# Patient Record
Sex: Female | Born: 1975 | Race: White | Hispanic: No | Marital: Single | Smoking: Former smoker
Health system: Southern US, Community
[De-identification: ages and names within clinical notes are randomized; demographics above are authoritative.]

## PROBLEM LIST (undated history)

## (undated) DIAGNOSIS — F419 Anxiety disorder, unspecified: Secondary | ICD-10-CM

## (undated) HISTORY — DX: Anxiety disorder, unspecified: F41.9

---

## 2015-05-22 ENCOUNTER — Emergency Department (HOSPITAL_COMMUNITY): Payer: Self-pay

## 2015-05-22 ENCOUNTER — Emergency Department (HOSPITAL_COMMUNITY)
Admission: EM | Admit: 2015-05-22 | Discharge: 2015-05-22 | Disposition: A | Discharge status: Home / Self Care | Payer: Self-pay | Attending: Emergency Medicine | Admitting: Emergency Medicine

## 2015-05-22 ENCOUNTER — Encounter (HOSPITAL_COMMUNITY): Payer: Self-pay | Admitting: Emergency Medicine

## 2015-05-22 DIAGNOSIS — F17200 Nicotine dependence, unspecified, uncomplicated: Secondary | ICD-10-CM | POA: Insufficient documentation

## 2015-05-22 DIAGNOSIS — K760 Fatty (change of) liver, not elsewhere classified: Secondary | ICD-10-CM | POA: Insufficient documentation

## 2015-05-22 DIAGNOSIS — F1010 Alcohol abuse, uncomplicated: Secondary | ICD-10-CM | POA: Insufficient documentation

## 2015-05-22 DIAGNOSIS — R112 Nausea with vomiting, unspecified: Secondary | ICD-10-CM

## 2015-05-22 DIAGNOSIS — R1013 Epigastric pain: Secondary | ICD-10-CM

## 2015-05-22 DIAGNOSIS — R197 Diarrhea, unspecified: Secondary | ICD-10-CM

## 2015-05-22 DIAGNOSIS — R52 Pain, unspecified: Secondary | ICD-10-CM

## 2015-05-22 DIAGNOSIS — F419 Anxiety disorder, unspecified: Secondary | ICD-10-CM | POA: Insufficient documentation

## 2015-05-22 DIAGNOSIS — Z789 Other specified health status: Secondary | ICD-10-CM

## 2015-05-22 LAB — CBC
HCT: 46.3 % — ABNORMAL HIGH (ref 36.0–46.0)
Hemoglobin: 17 g/dL — ABNORMAL HIGH (ref 12.0–15.0)
MCH: 35.1 pg — ABNORMAL HIGH (ref 26.0–34.0)
MCHC: 36.7 g/dL — ABNORMAL HIGH (ref 30.0–36.0)
MCV: 95.5 fL (ref 78.0–100.0)
Platelets: 271 10*3/uL (ref 150–400)
RBC: 4.85 MIL/uL (ref 3.87–5.11)
RDW: 13.4 % (ref 11.5–15.5)
WBC: 6.3 10*3/uL (ref 4.0–10.5)

## 2015-05-22 LAB — LIPASE, BLOOD: LIPASE: 29 U/L (ref 11–51)

## 2015-05-22 LAB — URINALYSIS, ROUTINE W REFLEX MICROSCOPIC
Bilirubin Urine: NEGATIVE
GLUCOSE, UA: NEGATIVE mg/dL
Hgb urine dipstick: NEGATIVE
Ketones, ur: NEGATIVE mg/dL
LEUKOCYTES UA: NEGATIVE
NITRITE: NEGATIVE
PROTEIN: NEGATIVE mg/dL
Specific Gravity, Urine: 1.005 (ref 1.005–1.030)
pH: 6.5 (ref 5.0–8.0)

## 2015-05-22 LAB — COMPREHENSIVE METABOLIC PANEL
ALT: 50 U/L (ref 14–54)
AST: 75 U/L — ABNORMAL HIGH (ref 15–41)
Albumin: 4.1 g/dL (ref 3.5–5.0)
Alkaline Phosphatase: 157 U/L — ABNORMAL HIGH (ref 38–126)
Anion gap: 18 — ABNORMAL HIGH (ref 5–15)
BILIRUBIN TOTAL: 0.8 mg/dL (ref 0.3–1.2)
BUN: <5 mg/dL — ABNORMAL LOW (ref 6–20)
CHLORIDE: 97 mmol/L — AB (ref 101–111)
CO2: 21 mmol/L — ABNORMAL LOW (ref 22–32)
CREATININE: 0.65 mg/dL (ref 0.44–1.00)
Calcium: 9.5 mg/dL (ref 8.9–10.3)
Glucose, Bld: 101 mg/dL — ABNORMAL HIGH (ref 65–99)
Potassium: 4.2 mmol/L (ref 3.5–5.1)
Sodium: 136 mmol/L (ref 135–145)
TOTAL PROTEIN: 7.3 g/dL (ref 6.5–8.1)

## 2015-05-22 LAB — I-STAT BETA HCG BLOOD, ED (MC, WL, AP ONLY)

## 2015-05-22 LAB — ETHANOL: ALCOHOL ETHYL (B): 33 mg/dL — AB (ref <5)

## 2015-05-22 LAB — MAGNESIUM: MAGNESIUM: 1.8 mg/dL (ref 1.7–2.4)

## 2015-05-22 MED ORDER — PROMETHAZINE HCL 25 MG PO TABS: 25.0000 mg | ORAL_TABLET | Freq: Four times a day (QID) | ORAL | Status: DC | PRN

## 2015-05-22 MED ORDER — PANTOPRAZOLE SODIUM 40 MG IV SOLR
40.0000 mg | Freq: Once | INTRAVENOUS | Status: AC
  Administered 2015-05-22: 40 mg via INTRAVENOUS
  Filled 2015-05-22: qty 40

## 2015-05-22 MED ORDER — SODIUM CHLORIDE 0.9 % IV BOLUS (SEPSIS)
1000.0000 mL | Freq: Once | INTRAVENOUS | Status: AC
  Administered 2015-05-22: 1000 mL via INTRAVENOUS

## 2015-05-22 MED ORDER — ONDANSETRON HCL 4 MG/2ML IJ SOLN
4.0000 mg | Freq: Once | INTRAMUSCULAR | Status: AC
Start: 2015-05-22 — End: 2015-05-22
  Administered 2015-05-22: 4 mg via INTRAVENOUS
  Filled 2015-05-22: qty 2

## 2015-05-22 MED ORDER — FAMOTIDINE 20 MG PO TABS: 20.0000 mg | ORAL_TABLET | Freq: Two times a day (BID) | ORAL | Status: DC

## 2015-05-22 MED ORDER — GI COCKTAIL ~~LOC~~
30.0000 mL | Freq: Once | ORAL | Status: AC
  Administered 2015-05-22: 30 mL via ORAL
  Filled 2015-05-22: qty 30

## 2015-05-22 MED ORDER — LORAZEPAM 2 MG/ML IJ SOLN
1.0000 mg | Freq: Once | INTRAMUSCULAR | Status: AC
  Administered 2015-05-22: 1 mg via INTRAVENOUS
  Filled 2015-05-22: qty 1

## 2015-05-22 MED ORDER — SODIUM CHLORIDE 0.9 % IV SOLN
Freq: Once | INTRAVENOUS | Status: AC
  Administered 2015-05-22: 14:00:00 via INTRAVENOUS

## 2015-05-22 NOTE — Discharge Instructions (Signed)
Read the information below.  Use the prescribed medication as directed.  Please discuss all new medications with your pharmacist.  You may return to the Emergency Department at any time for worsening condition or any new symptoms that concern you.    If you develop high fevers, worsening abdominal pain, uncontrolled vomiting, or are unable to tolerate fluids by mouth, return to the ER for a recheck.     Abdominal Pain, Adult Many things can cause abdominal pain. Usually, abdominal pain is not caused by a disease and will improve without treatment. It can often be observed and treated at home. Your health care provider will do a physical exam and possibly order blood tests and X-rays to help determine the seriousness of your pain. However, in many cases, more time must pass before a clear cause of the pain can be found. Before that point, your health care provider may not know if you need more testing or further treatment. HOME CARE INSTRUCTIONS Monitor your abdominal pain for any changes. The following actions may help to alleviate any discomfort you are experiencing:  Only take over-the-counter or prescription medicines as directed by your health care provider.  Do not take laxatives unless directed to do so by your health care provider.  Try a clear liquid diet (broth, tea, or water) as directed by your health care provider. Slowly move to a bland diet as tolerated. SEEK MEDICAL CARE IF:  You have unexplained abdominal pain.  You have abdominal pain associated with nausea or diarrhea.  You have pain when you urinate or have a bowel movement.  You experience abdominal pain that wakes you in the night.  You have abdominal pain that is worsened or improved by eating food.  You have abdominal pain that is worsened with eating fatty foods.  You have a fever. SEEK IMMEDIATE MEDICAL CARE IF:  Your pain does not go away within 2 hours.  You keep throwing up (vomiting).  Your pain is felt  only in portions of the abdomen, such as the right side or the left lower portion of the abdomen.  You pass bloody or black tarry stools. MAKE SURE YOU:  Understand these instructions.  Will watch your condition.  Will get help right away if you are not doing well or get worse.   This information is not intended to replace advice given to you by your health care provider. Make sure you discuss any questions you have with your health care provider.   Document Released: 10/08/2004 Document Revised: 09/19/2014 Document Reviewed: 09/07/2012 Elsevier Interactive Patient Education 2016 [employer].   [employer] Guide Outpatient Counseling/Substance Abuse Adult The United Ways 211 is a great source of information about community services available.  Access by dialing 2-1-1 from anywhere in [place], or by website -  PooledIncome.pl.   Other Local Resources (Updated 01/2015)  Crisis Hotlines   Services     Area Served  [employer]  Crisis Hotline, available 24 hours a day, 7 days a week: 504-187-8603 [place], [place]   [facility]  Crisis Hotline, available 24 hours a day, 7 days a week: 607-258-3869 [facility], [place]  [facility]  Suicide Prevention Hotline, available 24 hours a day, 7 days a week: 337-651-6445 [facility], [place]  [employer], available 24 hours a day, 7 days a week: ([phone] [phone] [facility], [place]   [facility] Access to [employer]ly[employer]ark Hotline, available 24 hours a day, 7 days  a week: [phone] All   [facility]  Crisis Hotline, available 24 hours a day, 7 days a week: 517-693-9615 All   Other Local Resources (Updated 01/2015)  Outpatient Counseling/ Substance Abuse Programs  Services     Address and Phone Number  ADS (Alcohol and Drug Services)   Options include Individual counseling, group counseling, intensive outpatient program (several hours a  day, several days a week)  Offers depression assessments  Provides methadone maintenance program [phone] [address] [place], [place] [address]   [facility]   Offers partial hospitalization/day treatment and DUI/DWI programs  [employer], private insurance [phone] [address] [place], [place] [address]  [facility]    Services include intensive outpatient program (several hours a day, several days a week), outpatient treatment, DUI/DWI services, family education  Also has some services specifically for [employer] transitional housing  [phone] [address] [place], [place] [address]     [facility]  [employer], private pay, and private insurance [phone] [address] [place], [place] [address]  [employer] of Care  Services include individual counseling, substance abuse intensive outpatient program (several hours a day, several days a week), day treatment  Delene Loll, Medicaid, private insurance [phone] [address], [address] [place], [place] [address]  [facility]   Offers substance abuse intensive outpatient program (several hours a day, several days a week), partial hospitalization program [phone] [address] [place], [place] [address]  [phone] [address] [place], [place] [address]  [phone] [address] [place], [place] [address]  [phone], [address] [place], [place] [address]  [facility]  Individual counseling only  Accepts private insurance only [phone] [address] [place], [place] [address]  [facility]  Methadone maintenance program [phone] [address] [place], [place] [address]  [facility]  Walk-In Clinic providing substance abuse and mental health  counseling  Accepts Medicaid, Medicare, private insurance  Offers sliding scale for uninsured [phone] [address] [place], [place]   [facility] [place], [employer].  Offers individual counseling, and intensive in-home services [phone] [address] [place], [place] [address]  [facility] individual counseling, family counseling, group therapy, domestic violence counseling, consumer credit counseling  Accepts Medicare, Medicaid, private insurance  Offers sliding scale for uninsured [phone] [address] [place], [place] [address]  [phone] [facility], [address] [place], [place] [address]  [facility]  Offers individual, family and group counseling  3 locations - [place], [place], and [place]  [phone]  [address] [place], [place] [address]  [address] [place], [place] [address]  [address] [place], [place] [address]  [facility] Margo Aye    Offers psychiatric assessment, 8-week Intensive Outpatient Program (several hours a day, several times a week, daytime or evenings), early recovery group, family Program, medication management  Private pay or private insurance only [phone], or  [phone] [address] [place], [place] [address]  [place] [facility] individual, couples and family counseling  Accepts Medicaid, private insurance, and sliding scale for uninsured [phone] [address] [place], [place] [address]  Len Blalock, MD  Individual counseling  Private insurance [phone] [address] [place], [place] [address]  [facility]   Offers assessment, substance abuse treatment, and behavioral health treatment [phone] [address]  [place], [place] [address]  [facility]  Individual counseling  Accepts private insurance [phone] [address] [place], [place] [address]  [facility]  Individual counseling  Delene Lollccepts Medicare, private insurance [phone] [address] [place], [place] [address]  [facility]    Offers intensive outpatient program (several hours a day, several times a week)  Private pay, private insurance [phone] [facility] [address] [place], [place]  [facility]  Individual counseling  Medicare, private insurance [phone] [address], [address] [place], [place] [address]  [facility]    Offers intensive outpatient program (several hours a day, several times a week) and partial hospitalization program [phone] [address] [place], [place] [address]  Emerson MonteParrish McKinney, MD  Individual counseling [phone] [address], [address] [place], [place] [address]  [facility]  Offers Christian counseling to individuals, couples, and families  Accepts Medicare and private insurance; offers sliding scale for uninsured [phone] [phone] [place], [place] [address]  [facility] counseling [phone] [address], [address] [place], [place] [address]  [facility]   Offers crisis counseling, individual counseling, group therapy, in-home therapy, domestic violence services, day treatment, DWI services, Administrator, artsCommunity Support Team (CST), Assertive Community Treatment Team (ACTT), substance abuse Intensive Outpatient Program (several hours a day, several times a week)  2 locations - [place] [phone] [address] [place], [place] [address]  [phone] [address] [place], [place] [address]  [facility]     Individual counseling and group therapy  Accepts private insurance, South[place]icare, [place] [phone] [address] [place], [place]  [facility]  Offers individual and family counseling  Offers LGBTQ services  Accepts  private insurance and private pay [phone] [address] [place], [place] [address]  [facility]    Offers individual counseling, group therapy, and outpatient detox  Accepts private insurance [phone] [address] [place], [place]  [facility]  Individual counseling  Accepts Medicare, private insurance [phone] [address], [address] [place], [place] [address]  [facility]  Individual counseling  Accepts Medicare, private insurance [phone] [address] [place], [place] [address]  [facility]phan[facility]   Offers substance abuse Intensive Outpatient Program (several hours a day, several times a week) [phone], or [phone] [place], [place]  [employer]Community Resource Guide Financial Assistance The United Ways 211 is a great source of information about community services available.  Access by dialing 2-1-1 from anywhere in [place], or by website -  PooledIncome.plwww.nc211.org.   Other Local Resources (Updated 01/2015)  Financial Assistance   Services    Phone Number and Address  [facility]  Low-cost medical care - 1st and 3rd Saturday of every month  Must not qualify for public or private insurance and must have limited income [phone] [address] [place], [place]    [facility] [employer] [place] Department of Social Services  Child care  Emergency assistance for housing and Kimberly-Clarkutilities  Food stamps  Medicaid [phone] [address] [place], [place] [address]   [facility]  Low-cost medical care for children, communicable diseases, sexually-transmitted diseases, immunizations, maternity care, womens health and family planning [phone] [address] [place], [place] [address]  [facility]   Medication assistance for [facility] residents  Must meet income requirements  [phone] [address] [place], [place].    [facility] Social  Services  Child care  Emergency assistance for housing and Kimberly[employer]Clark  Medicaid [phone] [address] [place], [place] [address]  [facility]   Low-cost medical care,   Monday through Friday, 9 am to 6 pm.   Accepts Medicare/Medicaid, and self-pay [phone] [address] [place], [place] [address]  [facility]  Low-cost medical care - Monday through Friday, 8:30 am - 5:30 pm  Accepts Medicaid and self-pay [phone] [address] [place], [place] [address]   [facility]  Primary medical care, including for those with sickle cell disease  Accepts Medicare, Medicaid, insurance and self-pay [phone] [address] [place], [place]  [facility]   Primary medical care  Accepts Medicare, [place], insurance and self-pay [phone] [address] [place], [place] [address]   [facility] Department of Social Services  Child care  Emergency assistance for housing and Kimberly-Clark  Medicaid [phone] [address] [place], [place] [address]  [facility] Department of Health and [employer]  Child care  Emergency assistance for housing and Kimberly-Clark  Medicaid [phone] [address] [place], [place] [address]   [facility] Medication Assistance Program  Medication assistance for [facility] residents with no insurance only  Must have a primary care doctor [phone] [address], [address] [place], [place]  [facility]   Primary medical care  [place], [place], insurance  [phone] [place], [place]  MedAssist   Medication assistance [phone]  [facility]   Primary medical care  Accepts Medicare, [place],  insurance and self-pay [phone] [address] [place], [place] [address]  [facility]   Primary medical care  Accepts Medicare, [place], insurance and self-pay [phone] [address] [place], [place] [address]  [facility]  For [place] residents between the ages of 63 and 8 who do not have any form of health insurance, Medicare, [place], or [place] benefits.  Services are provided free of charge to uninsured patients who fall within federal poverty guidelines.    Hours: Tuesdays and Thursdays, 4:15 - 8 pm [phone] [address] [place], [place] [address]  [facility]     Primary medical care  Dental care  Nutritional counseling  Pharmacy  Accepts Medicaid, Medicare, most insurance.  Fees are adjusted based on ability to pay.   [phone] [facility] [address] [place], [place]  [phone] Phineas Real [facility] [address] [place], [place]  [phone] [facility] [place], [place]  [phone] [facility], [address] [place], [place]  [phone] [facility] [address] [place], [place]  [facility]  Womens health and family planning [phone] [address] [place], [place]  [facility] Department of Social Services  Child care  Emergency assistance for housing and Kimberly-Clark  Medicaid [phone] [address] [place], [place] [address]   [facility]    Ages 6 and older  Hours: Mondays and Thursdays, 7:00 am - 9:00 am Patients are seen on a first come, first served basis. [phone], ext. [phone] [place], [place]  [facility] Division of Social Services  Child care  Emergency assistance for housing and [employer]ly[employer]ark  Medicaid [phone] [address] [place], [place]  [address]  [facility] [employer] [facility]  Medication assistance  Rental assistance  Food pantry  Medication assistance  Housing assistance  Emergency food distribution  Utility assistance [phone] [address] [place], [place]  [phone]  [address] [place], [place] [address] Hours: Tuesdays and Thursdays from 9am - 12 noon by appointment only  [phone] [address] [place], [place] [address]  [facility] - Lanae Boast   Accepts private insurance, [place], and [place].  Payment is based on a sliding scale for those without insurance.  Hours: Mondays, Tuesdays and Thursdays, 8:30 am - 5:30 pm.   [phone] [address] [place], [place]  [facility] - Family Medicine at [facility], [place], and [place].  Payment is based on a sliding scale for those without insurance. [phone] [address] [place], [place]  [facility] - Pediatrics at E. Scientist, research (physical sciences), [employer], and [place].  Payment is based on a sliding scale for those without insurance [phone] [address], [employer], [place]  [facility] - Pediatrics at [employer], [place], and [place].  Payment is based on a sliding scale for those without insurance. [phone] [address] [place], [place]  [facility] - Pediatrics at [facility], [place], and [place].  Payment is based on a sliding scale for those without insurance. [phone], ext. [phone] [place], [place].    [facility]  Maternity care.  Accepts Medicaid and self-pay. [phone] [address] [place], [place]

## 2015-05-22 NOTE — ED Notes (Signed)
Pt reports emesis for past 5 days. Has been able to keep fluids down intermittently, has not been able to keep down solid food. Had a few episodes of diarrhea. Also having abd cramping and weakness.

## 2015-05-22 NOTE — ED Notes (Signed)
Pt has been drinking from her water bottle at bedside and reports that she has been able to keep it down.

## 2015-05-22 NOTE — ED Provider Notes (Addendum)
CSN: [id number]     Arrival date & time 05/22/15  [id number] History   First MD Initiated Contact with Patient 05/22/15 1029     Chief Complaint  Patient presents with  . Emesis     (Consider location/radiation/quality/duration/timing/severity/associated sxs/prior Treatment) HPI   Pt presents with 5 days of N/V/D, 2 days of epigastric pain, increased anxiety with intermittent chest pressure that feels like prior anxiety.  Has not been able to tolerate fluids well.  Has only kept down water.  States she does not take care of herself well and is supposed to take iron, magnesium, and folic acid but does not.  Also drinks 4-5 beers daily.  Denies taking many NSAIDS.  Denies fever, cough, bloody emesis or stool, urinary or vaginal symptoms.  LMP 2-3 weeks ago.   No hx abdominal surgeries.   History reviewed. No pertinent past medical history. History reviewed. No pertinent past surgical history. History reviewed. No pertinent family history. Social History  Substance Use Topics  . Smoking status: Current Every Day Smoker  . Smokeless tobacco: None  . Alcohol Use: Yes   OB History    No data available     Review of Systems  All other systems reviewed and are negative.     Allergies  Review of patient's allergies indicates no known allergies.  Home Medications   Prior to Admission medications   Not on File   BP 140/100 mmHg  Pulse 117  Temp(Src) 98.3 F (36.8 C) (Oral)  Resp 20  SpO2 98% Physical Exam  Constitutional: She appears well-developed and well-nourished. No distress.  HENT:  Head: Normocephalic and atraumatic.  Eyes: Conjunctivae are normal.  Neck: Neck supple.  Cardiovascular: Normal rate and regular rhythm.   Pulmonary/Chest: Effort normal and breath sounds normal. No respiratory distress. She has no wheezes. She has no rales.  Abdominal: Soft. Bowel sounds are normal. She exhibits no distension. There is tenderness in the epigastric area. There is no rebound  and no guarding.  Neurological: She is alert.  Skin: She is not diaphoretic.  Psychiatric: Her mood appears anxious.  Extremely anxious, shaking   Nursing note and vitals reviewed.   ED Course  Procedures (including critical care time) Labs Review Labs Reviewed  COMPREHENSIVE METABOLIC PANEL - Abnormal; Notable for the following:    Chloride 97 (*)    CO2 21 (*)    Glucose, Bld 101 (*)    BUN <5 (*)    AST 75 (*)    Alkaline Phosphatase 157 (*)    Anion gap 18 (*)    All other components within normal limits  CBC - Abnormal; Notable for the following:    Hemoglobin 17.0 (*)    HCT 46.3 (*)    MCH 35.1 (*)    MCHC 36.7 (*)    All other components within normal limits  URINALYSIS, ROUTINE W REFLEX MICROSCOPIC (NOT AT [facility]) - Abnormal; Notable for the following:    APPearance HAZY (*)    All other components within normal limits  ETHANOL - Abnormal; Notable for the following:    Alcohol, Ethyl (B) 33 (*)    All other components within normal limits  LIPASE, BLOOD  MAGNESIUM  I-STAT BETA HCG BLOOD, ED ([facility], [facility], [facility] ONLY)    Imaging Review Dg Chest 2 View  05/22/2015  CLINICAL DATA:  Cough, abdominal pain, fever EXAM: CHEST  2 VIEW COMPARISON:  None. FINDINGS: The heart size and mediastinal contours are within normal limits. Both lungs  are clear. The visualized skeletal structures are unremarkable. IMPRESSION: No active cardiopulmonary disease. Electronically Signed   By: Elige Ko   On: 05/22/2015 13:48   US Abdomen Limited Ruq  05/22/2015  CLINICAL DATA:  Acute right upper quadrant abdominal pain. EXAM: US ABDOMEN LIMITED - RIGHT UPPER QUADRANT COMPARISON:  None. FINDINGS: Gallbladder: No gallstones or wall thickening visualized. No sonographic Murphy sign noted by sonographer. Common bile duct: Diameter: 3.5 mm which is within normal limits. Liver: No focal lesion identified. Increased echogenicity is noted suggesting fatty infiltration. IMPRESSION: Fatty infiltration of the  liver. No other abnormality seen in the right upper quadrant of the abdomen. Electronically Signed   By: Lupita Raider, M.D.   On: 05/22/2015 13:35   I have personally reviewed and evaluated these images and lab results as part of my medical decision-making.   EKG Interpretation   Date/Time:  Wednesday May 22 2015 11:10:47 EDT Ventricular Rate:  98 PR Interval:  145 QRS Duration: 79 QT Interval:  344 QTC Calculation: 439 R Axis:   70 Text Interpretation:  Sinus rhythm Probable anteroseptal infarct, old  Confirmed by BEATON  MD, ROBERT [phone]) on 05/22/2015 11:14:36 AM Also  confirmed by Radford Pax  MD, ROBERT [phone]), editor [place] CT, Jola Babinski [phone])   on 05/22/2015 11:33:19 AM      MDM   Final diagnoses:  Pain  Epigastric pain  Nausea vomiting and diarrhea  Fatty liver  Alcohol ingestion, more than 4 drinks/day   Afebrile nontoxic patient with N/V/D x 5 days with epigastric pain, increased anxiety.  Labs consistent for dehydration.  Abdominal exam is benign.  Suspect alcoholic or other gastritis vs GERD vs viral gastroenteritis.  LFTs consistent with alcoholism.  RUQ US demonstrates fatty infiltration of liver.  IVF, protonix, GI cocktail, zofran, ativan given with improvement.  Shaking relieved with ativan. Pt notes she specifically felt relief of the pain with the GI cocktail.  Tolerating PO.  D/C home with phenergan, pepcid, resources for PCP follow up and alcohol abuse follow up.  Discussed result, findings, treatment, and follow up  with patient.  Pt given return precautions.  Pt verbalizes understanding and agrees with plan.      Trixie Dredge, PA-C 05/22/15 1518  Nelva Nay, MD 05/22/15 [address] PA-C 05/22/15 1651  Nelva Nay, MD 05/27/15 [phone]

## 2015-05-22 NOTE — ED Notes (Signed)
Pt is aware of needing a urine sample. Pt states she doesn't have to go at this time, but wil notify when she is able to void.

## 2016-09-09 IMAGING — US US ABDOMEN LIMITED
1 series · 14 of 25 positions shown · non-contrast
Comparison: None.

CLINICAL DATA: Acute right upper quadrant abdominal pain.

EXAM:
US ABDOMEN LIMITED - RIGHT UPPER QUADRANT

[Series 1: us abdomen limited · 0.23mm/px · 14 of 52 slices shown]
[im 1/52]
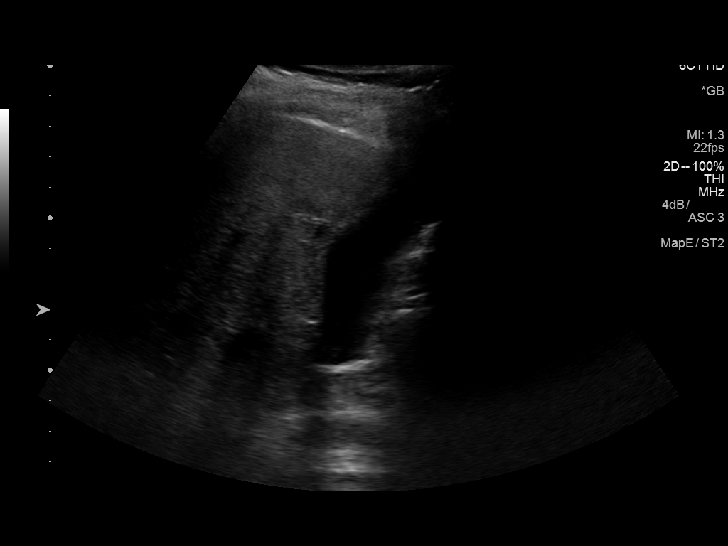
[im 5/52]
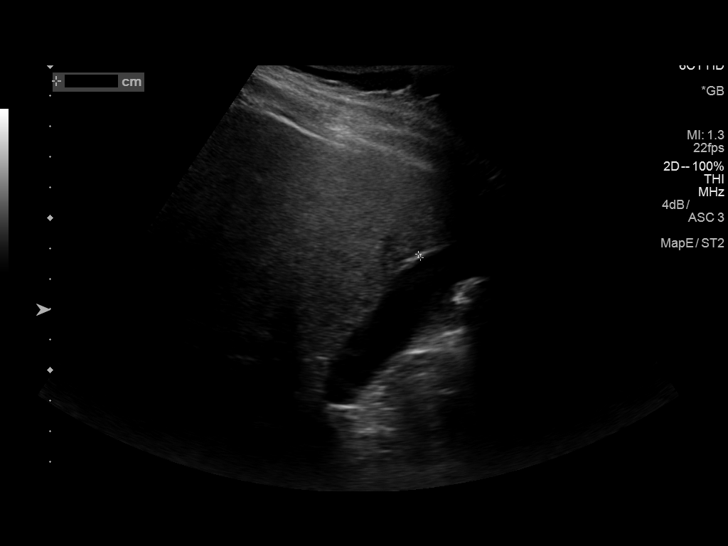
[im 9/52]
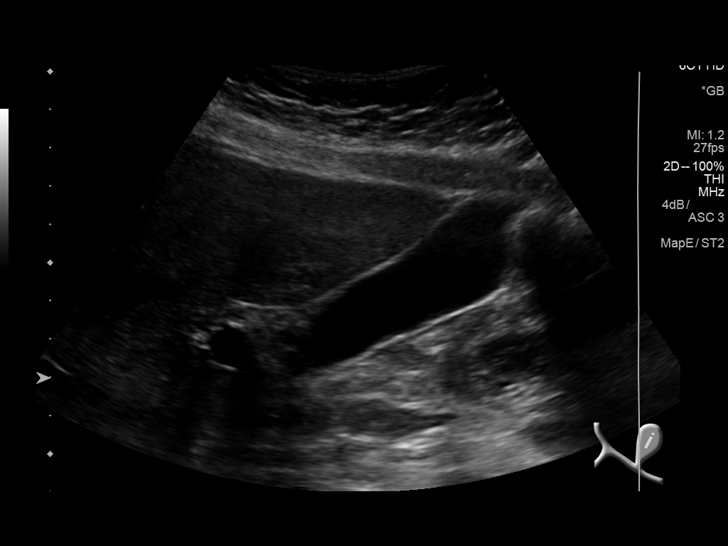
[im 13/52]
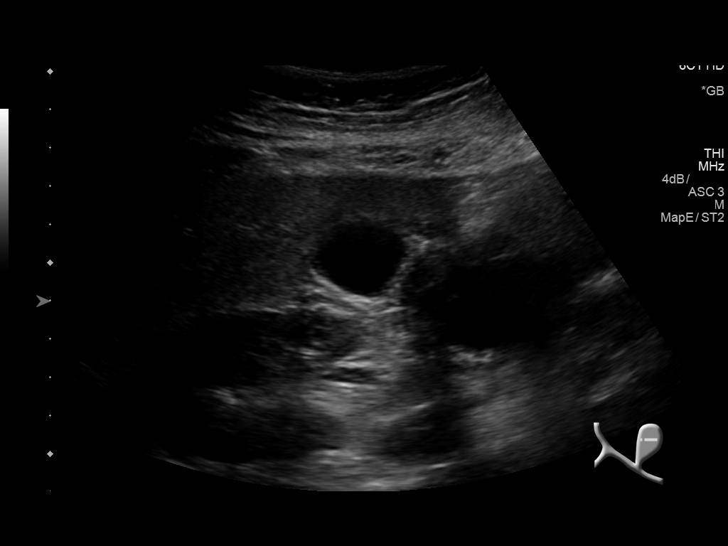
[im 18/52]
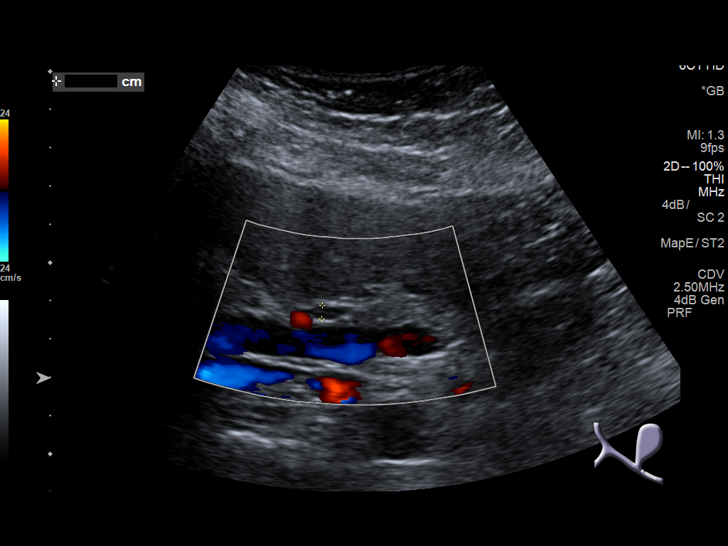
[im 20/52]
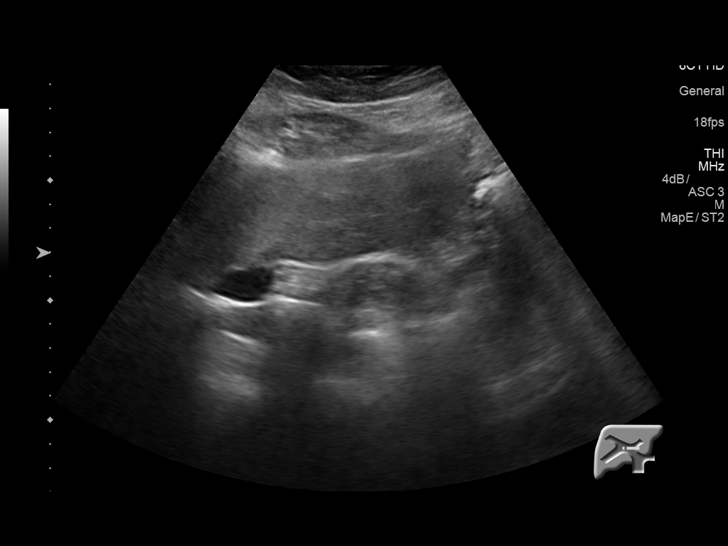
[im 24/52]
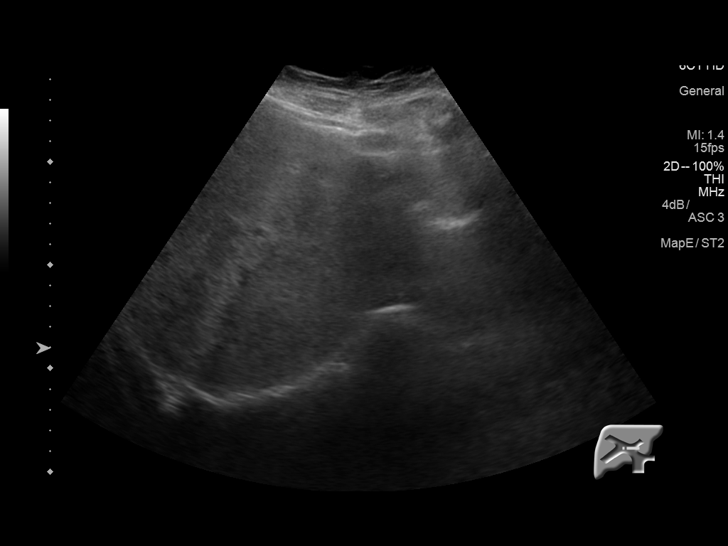
[im 28/52]
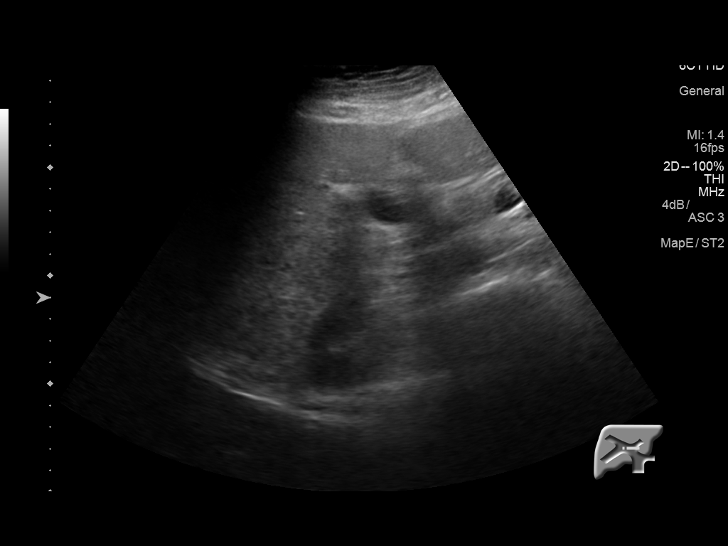
[im 32/52]
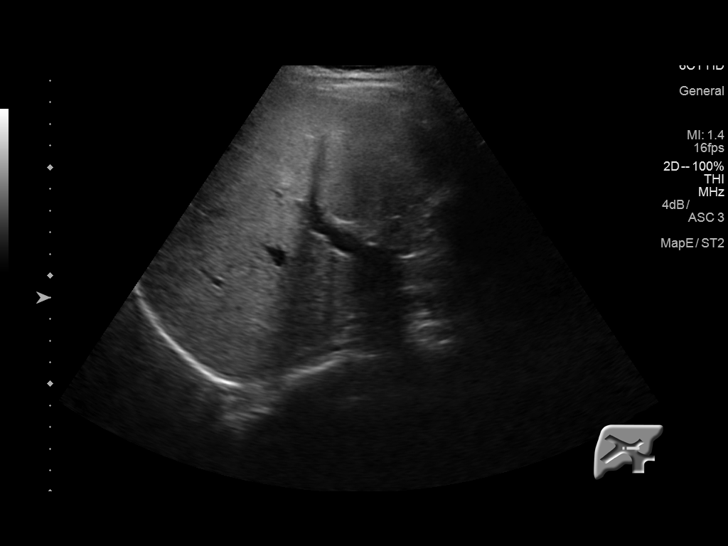
[im 35/52]
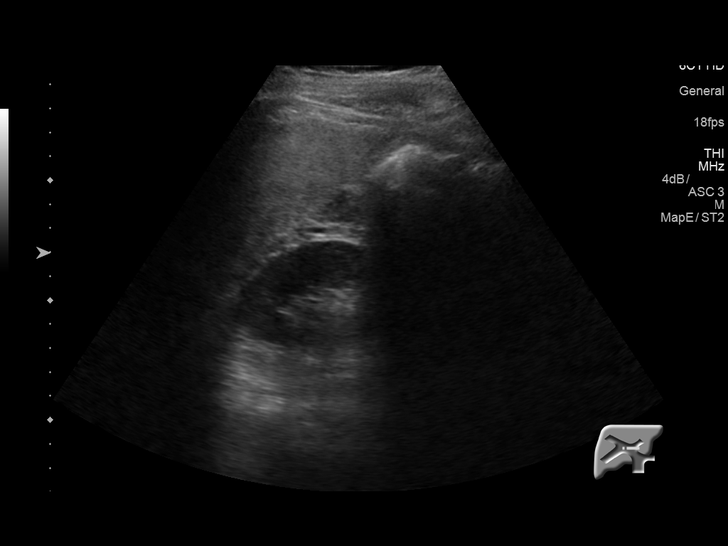
[im 39/52]
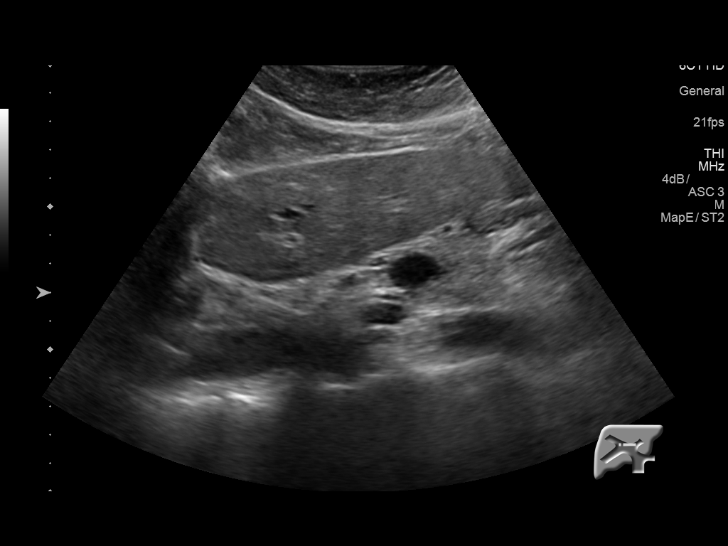
[im 43/52]
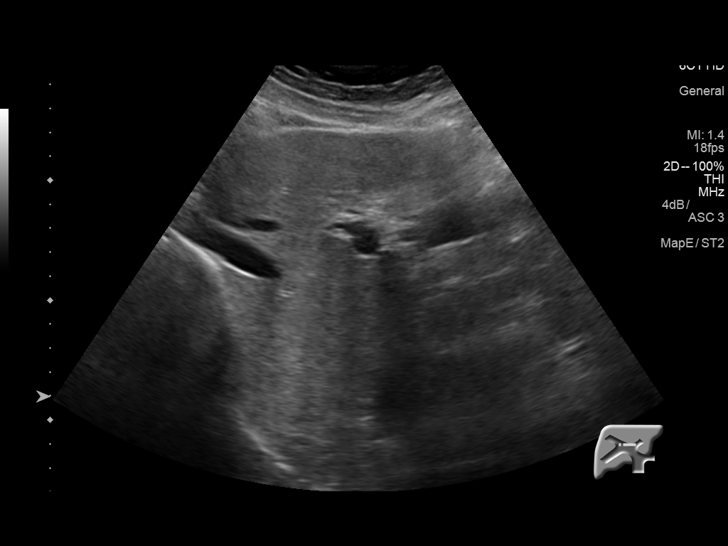
[im 47/52]
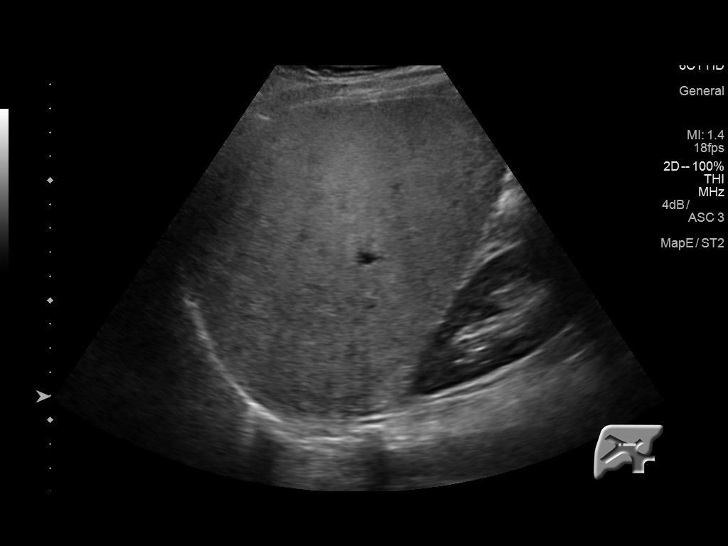
[im 52/52]
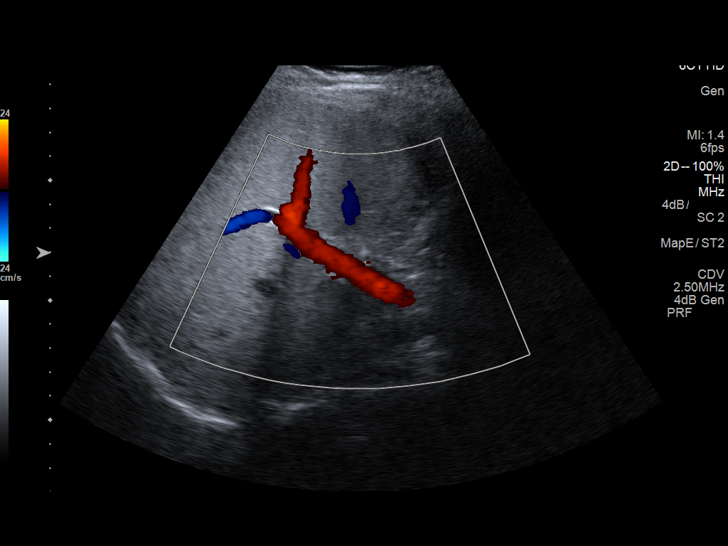

[14 of 25 positions shown; findings below may reference images not displayed]

FINDINGS: Gallbladder:

No gallstones or wall thickening visualized. No sonographic Murphy
sign noted by sonographer.

Common bile duct:

Diameter: 3.5 mm which is within normal limits.

Liver:

No focal lesion identified. Increased echogenicity is noted
suggesting fatty infiltration.
IMPRESSION: Fatty infiltration of the liver. No other abnormality seen in the
right upper quadrant of the abdomen.

## 2017-10-23 ENCOUNTER — Ambulatory Visit: Payer: Self-pay | Admitting: Family Medicine

## 2017-11-05 ENCOUNTER — Ambulatory Visit: Payer: BLUE CROSS/BLUE SHIELD | Admitting: Emergency Medicine

## 2017-11-05 ENCOUNTER — Other Ambulatory Visit: Payer: Self-pay

## 2017-11-05 ENCOUNTER — Encounter: Payer: Self-pay | Admitting: Emergency Medicine

## 2017-11-05 DIAGNOSIS — Z0000 Encounter for general adult medical examination without abnormal findings: Secondary | ICD-10-CM | POA: Diagnosis not present

## 2017-11-05 DIAGNOSIS — Z1329 Encounter for screening for other suspected endocrine disorder: Secondary | ICD-10-CM | POA: Diagnosis not present

## 2017-11-05 DIAGNOSIS — Z13228 Encounter for screening for other metabolic disorders: Secondary | ICD-10-CM

## 2017-11-05 DIAGNOSIS — Z13220 Encounter for screening for lipoid disorders: Secondary | ICD-10-CM | POA: Diagnosis not present

## 2017-11-05 DIAGNOSIS — Z130 Encounter for screening for diseases of the blood and blood-forming organs and certain disorders involving the immune mechanism: Secondary | ICD-10-CM | POA: Diagnosis not present

## 2017-11-05 NOTE — Progress Notes (Signed)
Renee Compton 42 y.o.   Chief Complaint  Patient presents with  . Establish Care  . FAMILY HX DIABETES    lab work to check thyroid, diabetes,cholesterol    HISTORY OF PRESENT ILLNESS: This is a 42 y.o. female here to establish care.  First visit to this office.  Has a family history of diabetes.  Requesting blood work.  Father recently passed away from pancreas cancer and mother was diagnosed with kidney cancer recently. Asymptomatic. Chronic smoker. HPI   Prior to Admission medications   Medication Sig Start Date End Date Taking? Authorizing Provider  Cyanocobalamin (VITAMIN B-12 PO) Take 1 tablet by mouth daily.    [provider]  famotidine (PEPCID) 20 MG tablet Take 1 tablet (20 mg total) by mouth 2 (two) times daily. Patient not taking: Reported on 11/05/2017 05/22/15   Renee Bibles, PA-C  ferrous sulfate 325 (65 FE) MG tablet Take 325 mg by mouth daily.    [provider]  folic acid (FOLVITE) 1 MG tablet Take 1 mg by mouth daily.    [provider]  magnesium oxide (MAG-OX) 400 MG tablet Take 400 mg by mouth daily.    [provider]  promethazine (PHENERGAN) 25 MG tablet Take 1 tablet (25 mg total) by mouth every 6 (six) hours as needed for nausea or vomiting. Patient not taking: Reported on 11/05/2017 05/22/15   Renee Bibles, PA-C    No Known Allergies  There are no active problems to display for this patient.   No past medical history on file.  No past surgical history on file.  Social History   Socioeconomic History  . Marital status: Single    Spouse name: Not on file  . Number of children: Not on file  . Years of education: Not on file  . Highest education level: Not on file  Occupational History  . Not on file  Social Needs  . Financial resource strain: Not on file  . Food insecurity:    Worry: Not on file    Inability: Not on file  . Transportation needs:    Medical: Not on file    Non-medical: Not on file   Tobacco Use  . Smoking status: Current Every Day Smoker    Packs/day: 1.00    Years: 20.00    Pack years: 20.00    Types: Cigarettes  . Smokeless tobacco: Never Used  Substance and Sexual Activity  . Alcohol use: Not Currently  . Drug use: Never  . Sexual activity: Not on file  Lifestyle  . Physical activity:    Days per week: Not on file    Minutes per session: Not on file  . Stress: Not on file  Relationships  . Social connections:    Talks on phone: Not on file    Gets together: Not on file    Attends religious service: Not on file    Active member of club or organization: Not on file    Attends meetings of clubs or organizations: Not on file    Relationship status: Not on file  . Intimate partner violence:    Fear of current or ex partner: Not on file    Emotionally abused: Not on file    Physically abused: Not on file    Forced sexual activity: Not on file  Other Topics Concern  . Not on file  Social History Narrative  . Not on file    No family history on file.   Review  of Systems  Constitutional: Negative.  Negative for chills, fever and weight loss.  HENT: Negative.   Eyes: Negative.  Negative for blurred vision and double vision.  Respiratory: Negative.  Negative for cough, hemoptysis and shortness of breath.   Cardiovascular: Negative.  Negative for chest pain and palpitations.  Gastrointestinal: Negative.  Negative for abdominal pain, diarrhea, nausea and vomiting.  Genitourinary: Negative.  Negative for dysuria and frequency.  Musculoskeletal: Negative.  Negative for back pain, joint pain, myalgias and neck pain.  Skin: Negative.  Negative for rash.  Neurological: Negative.  Negative for dizziness and headaches.  Endo/Heme/Allergies: Negative.   All other systems reviewed and are negative.     Vitals:   11/05/17 1140  BP: 119/79  Pulse: 98  Resp: 16  Temp: 98.4 F (36.9 C)  SpO2: 98%    Physical Exam  Constitutional: She is oriented to  person, place, and time. She appears well-developed and well-nourished.  HENT:  Head: Normocephalic and atraumatic.  Nose: Nose normal.  Mouth/Throat: Oropharynx is clear and moist.  Eyes: Pupils are equal, round, and reactive to light. Conjunctivae and EOM are normal.  Neck: Normal range of motion. Neck supple. No JVD present.  Cardiovascular: Normal rate, regular rhythm and normal heart sounds.  Pulmonary/Chest: Effort normal and breath sounds normal.  Abdominal: Soft. Bowel sounds are normal. She exhibits no distension. There is no tenderness.  Musculoskeletal: Normal range of motion. She exhibits no edema.  Lymphadenopathy:    She has no cervical adenopathy.  Neurological: She is alert and oriented to person, place, and time. No sensory deficit. She exhibits normal muscle tone. Coordination normal.  Skin: Skin is warm and dry. Capillary refill takes less than 2 seconds.  Psychiatric: She has a normal mood and affect. Her behavior is normal.  Vitals reviewed.    ASSESSMENT & PLAN: Renee Compton was seen today for establish care and family hx diabetes.  Diagnoses and all orders for this visit:  Routine general medical examination at a health care facility  Screening for deficiency anemia -     CBC with Differential -     Vitamin B12  Screening for lipoid disorders -     Lipid panel  Screening for endocrine, metabolic and immunity disorder -     CBC with Differential -     Comprehensive metabolic panel -     Hemoglobin A1c -     Lipid panel -     TSH -     Vitamin B12 -     Magnesium   Patient Instructions       If you have lab work done today you will be contacted with your lab results within the next 2 weeks.  If you have not heard from [place] then please contact us. The fastest way to get your results is to register for My Chart.   IF you received an x-ray today, you will receive an invoice from [facility]. Please contact [facility] at [phone]  with questions or concerns regarding your invoice.   IF you received labwork today, you will receive an invoice from [facility]. Please contact [facility] at [phone] with questions or concerns regarding your invoice.   Our billing staff will not be able to assist you with questions regarding bills from these companies.  You will be contacted with the lab results as soon as they are available. The fastest way to get your results is to activate your My Chart account. Instructions are located on the last  page of this paperwork. If you have not heard from [place] regarding the results in 2 weeks, please contact this office.     Health Maintenance, Female Adopting a healthy lifestyle and getting preventive care can go a long way to promote health and wellness. Talk with your health care provider about what schedule of regular examinations is right for you. This is a good chance for you to check in with your provider about disease prevention and staying healthy. In between checkups, there are plenty of things you can do on your own. Experts have done a lot of research about which lifestyle changes and preventive measures are most likely to keep you healthy. Ask your health care provider for more information. Weight and diet Eat a healthy diet  Be sure to include plenty of vegetables, fruits, low-fat dairy products, and lean protein.  Do not eat a lot of foods high in solid fats, added sugars, or salt.  Get regular exercise. This is one of the most important things you can do for your health. ? Most adults should exercise for at least 150 minutes each week. The exercise should increase your heart rate and make you sweat (moderate-intensity exercise). ? Most adults should also do strengthening exercises at least twice a week. This is in addition to the moderate-intensity exercise.  Maintain a healthy weight  Body mass index (BMI) is a measurement that can be used to identify possible weight problems.  It estimates body fat based on height and weight. Your health care provider can help determine your BMI and help you achieve or maintain a healthy weight.  For females 77 years of age and older: ? A BMI below 18.5 is considered underweight. ? A BMI of 18.5 to 24.9 is normal. ? A BMI of 25 to 29.9 is considered overweight. ? A BMI of 30 and above is considered obese.  Watch levels of cholesterol and blood lipids  You should start having your blood tested for lipids and cholesterol at 44 years of age, then have this test every 5 years.  You may need to have your cholesterol levels checked more often if: ? Your lipid or cholesterol levels are high. ? You are older than 44 years of age. ? You are at high risk for heart disease.  Cancer screening Lung Cancer  Lung cancer screening is recommended for adults 69-24 years old who are at high risk for lung cancer because of a history of smoking.  A yearly low-dose CT scan of the lungs is recommended for people who: ? Currently smoke. ? Have quit within the past 15 years. ? Have at least a 30-pack-year history of smoking. A pack year is smoking an average of one pack of cigarettes a day for 1 year.  Yearly screening should continue until it has been 15 years since you quit.  Yearly screening should stop if you develop a health problem that would prevent you from having lung cancer treatment.  Breast Cancer  Practice breast self-awareness. This means understanding how your breasts normally appear and feel.  It also means doing regular breast self-exams. Let your health care provider know about any changes, no matter how small.  If you are in your 20s or 30s, you should have a clinical breast exam (CBE) by a health care provider every 1-3 years as part of a regular health exam.  If you are 12 or older, have a CBE every year. Also consider having a breast X-ray (mammogram) every year.  If you have a family history of breast cancer, talk to  your health care provider about genetic screening.  If you are at high risk for breast cancer, talk to your health care provider about having an MRI and a mammogram every year.  Breast cancer gene (BRCA) assessment is recommended for women who have family members with BRCA-related cancers. BRCA-related cancers include: ? Breast. ? Ovarian. ? Tubal. ? Peritoneal cancers.  Results of the assessment will determine the need for genetic counseling and BRCA1 and BRCA2 testing.  Cervical Cancer Your health care provider may recommend that you be screened regularly for cancer of the pelvic organs (ovaries, uterus, and vagina). This screening involves a pelvic examination, including checking for microscopic changes to the surface of your cervix (Pap test). You may be encouraged to have this screening done every 3 years, beginning at age 82.  For women ages 92-65, health care providers may recommend pelvic exams and Pap testing every 3 years, or they may recommend the Pap and pelvic exam, combined with testing for human papilloma virus (HPV), every 5 years. Some types of HPV increase your risk of cervical cancer. Testing for HPV may also be done on women of any age with unclear Pap test results.  Other health care providers may not recommend any screening for nonpregnant women who are considered low risk for pelvic cancer and who do not have symptoms. Ask your health care provider if a screening pelvic exam is right for you.  If you have had past treatment for cervical cancer or a condition that could lead to cancer, you need Pap tests and screening for cancer for at least 20 years after your treatment. If Pap tests have been discontinued, your risk factors (such as having a new sexual partner) need to be reassessed to determine if screening should resume. Some women have medical problems that increase the chance of getting cervical cancer. In these cases, your health care provider may recommend more  frequent screening and Pap tests.  Colorectal Cancer  This type of cancer can be detected and often prevented.  Routine colorectal cancer screening usually begins at 44 years of age and continues through 44 years of age.  Your health care provider may recommend screening at an earlier age if you have risk factors for colon cancer.  Your health care provider may also recommend using home test kits to check for hidden blood in the stool.  A small camera at the end of a tube can be used to examine your colon directly (sigmoidoscopy or colonoscopy). This is done to check for the earliest forms of colorectal cancer.  Routine screening usually begins at age 22.  Direct examination of the colon should be repeated every 5-10 years through 44 years of age. However, you may need to be screened more often if early forms of precancerous polyps or small growths are found.  Skin Cancer  Check your skin from head to toe regularly.  Tell your health care provider about any new moles or changes in moles, especially if there is a change in a mole's shape or color.  Also tell your health care provider if you have a mole that is larger than the size of a pencil eraser.  Always use sunscreen. Apply sunscreen liberally and repeatedly throughout the day.  Protect yourself by wearing long sleeves, pants, a wide-brimmed hat, and sunglasses whenever you are outside.  Heart disease, diabetes, and high blood pressure  High blood pressure causes heart disease and  increases the risk of stroke. High blood pressure is more likely to develop in: ? People who have blood pressure in the high end of the normal range (130-139/85-89 mm Hg). ? People who are overweight or obese. ? People who are African American.  If you are 37-99 years of age, have your blood pressure checked every 3-5 years. If you are 25 years of age or older, have your blood pressure checked every year. You should have your blood pressure measured  twice-once when you are at a hospital or clinic, and once when you are not at a hospital or clinic. Record the average of the two measurements. To check your blood pressure when you are not at a hospital or clinic, you can use: ? An automated blood pressure machine at a pharmacy. ? A home blood pressure monitor.  If you are between 9 years and 11 years old, ask your health care provider if you should take aspirin to prevent strokes.  Have regular diabetes screenings. This involves taking a blood sample to check your fasting blood sugar level. ? If you are at a normal weight and have a low risk for diabetes, have this test once every three years after 44 years of age. ? If you are overweight and have a high risk for diabetes, consider being tested at a younger age or more often. Preventing infection Hepatitis B  If you have a higher risk for hepatitis B, you should be screened for this virus. You are considered at high risk for hepatitis B if: ? You were born in a country where hepatitis B is common. Ask your health care provider which countries are considered high risk. ? Your parents were born in a high-risk country, and you have not been immunized against hepatitis B (hepatitis B vaccine). ? You have HIV or AIDS. ? You use needles to inject street drugs. ? You live with someone who has hepatitis B. ? You have had sex with someone who has hepatitis B. ? You get hemodialysis treatment. ? You take certain medicines for conditions, including cancer, organ transplantation, and autoimmune conditions.  Hepatitis C  Blood testing is recommended for: ? Everyone born from [birth year] through 1967. ? Anyone with known risk factors for hepatitis C.  Sexually transmitted infections (STIs)  You should be screened for sexually transmitted infections (STIs) including gonorrhea and chlamydia if: ? You are sexually active and are younger than 44 years of age. ? You are older than 44 years of age and your  health care provider tells you that you are at risk for this type of infection. ? Your sexual activity has changed since you were last screened and you are at an increased risk for chlamydia or gonorrhea. Ask your health care provider if you are at risk.  If you do not have HIV, but are at risk, it may be recommended that you take a prescription medicine daily to prevent HIV infection. This is called pre-exposure prophylaxis (PrEP). You are considered at risk if: ? You are sexually active and do not regularly use condoms or know the HIV status of your partner(s). ? You take drugs by injection. ? You are sexually active with a partner who has HIV.  Talk with your health care provider about whether you are at high risk of being infected with HIV. If you choose to begin PrEP, you should first be tested for HIV. You should then be tested every 3 months for as long as you are taking  PrEP. Pregnancy  If you are premenopausal and you may become pregnant, ask your health care provider about preconception counseling.  If you may become pregnant, take 400 to 800 micrograms (mcg) of folic acid every day.  If you want to prevent pregnancy, talk to your health care provider about birth control (contraception). Osteoporosis and menopause  Osteoporosis is a disease in which the bones lose minerals and strength with aging. This can result in serious bone fractures. Your risk for osteoporosis can be identified using a bone density scan.  If you are 5 years of age or older, or if you are at risk for osteoporosis and fractures, ask your health care provider if you should be screened.  Ask your health care provider whether you should take a calcium or vitamin D supplement to lower your risk for osteoporosis.  Menopause may have certain physical symptoms and risks.  Hormone replacement therapy may reduce some of these symptoms and risks. Talk to your health care provider about whether hormone replacement  therapy is right for you. Follow these instructions at home:  Schedule regular health, dental, and eye exams.  Stay current with your immunizations.  Do not use any tobacco products including cigarettes, chewing tobacco, or electronic cigarettes.  If you are pregnant, do not drink alcohol.  If you are breastfeeding, limit how much and how often you drink alcohol.  Limit alcohol intake to no more than 1 drink per day for nonpregnant women. One drink equals 12 ounces of beer, 5 ounces of wine, or 1 ounces of hard liquor.  Do not use street drugs.  Do not share needles.  Ask your health care provider for help if you need support or information about quitting drugs.  Tell your health care provider if you often feel depressed.  Tell your health care provider if you have ever been abused or do not feel safe at home. This information is not intended to replace advice given to you by your health care provider. Make sure you discuss any questions you have with your health care provider. Document Released: 07/14/2010 Document Revised: 06/06/2015 Document Reviewed: 10/02/2014 [employer]vier Interactive Patient Education  2018 [employer]      Agustina Caroli, MD [facility]

## 2017-11-05 NOTE — Patient Instructions (Addendum)
If you have lab work done today you will be contacted with your lab results within the next 2 weeks.  If you have not heard from [place] then please contact us. The fastest way to get your results is to register for My Chart.   IF you received an x-ray today, you will receive an invoice from [facility]. Please contact [facility] at [phone] with questions or concerns regarding your invoice.   IF you received labwork today, you will receive an invoice from [place]. Please contact [facility] at [phone] with questions or concerns regarding your invoice.   Our billing staff will not be able to assist you with questions regarding bills from these companies.  You will be contacted with the lab results as soon as they are available. The fastest way to get your results is to activate your My Chart account. Instructions are located on the last page of this paperwork. If you have not heard from [place] regarding the results in 2 weeks, please contact this office.     Health Maintenance, Female Adopting a healthy lifestyle and getting preventive care can go a long way to promote health and wellness. Talk with your health care provider about what schedule of regular examinations is right for you. This is a good chance for you to check in with your provider about disease prevention and staying healthy. In between checkups, there are plenty of things you can do on your own. Experts have done a lot of research about which lifestyle changes and preventive measures are most likely to keep you healthy. Ask your health care provider for more information. Weight and diet Eat a healthy diet  Be sure to include plenty of vegetables, fruits, low-fat dairy products, and lean protein.  Do not eat a lot of foods high in solid fats, added sugars, or salt.  Get regular exercise. This is one of the most important things you can do for your health. ? Most adults should exercise for at least 150  minutes each week. The exercise should increase your heart rate and make you sweat (moderate-intensity exercise). ? Most adults should also do strengthening exercises at least twice a week. This is in addition to the moderate-intensity exercise.  Maintain a healthy weight  Body mass index (BMI) is a measurement that can be used to identify possible weight problems. It estimates body fat based on height and weight. Your health care provider can help determine your BMI and help you achieve or maintain a healthy weight.  For females 22 years of age and older: ? A BMI below 18.5 is considered underweight. ? A BMI of 18.5 to 24.9 is normal. ? A BMI of 25 to 29.9 is considered overweight. ? A BMI of 30 and above is considered obese.  Watch levels of cholesterol and blood lipids  You should start having your blood tested for lipids and cholesterol at 44 years of age, then have this test every 5 years.  You may need to have your cholesterol levels checked more often if: ? Your lipid or cholesterol levels are high. ? You are older than 44 years of age. ? You are at high risk for heart disease.  Cancer screening Lung Cancer  Lung cancer screening is recommended for adults 58-34 years old who are at high risk for lung cancer because of a history of smoking.  A yearly low-dose CT scan of the lungs is recommended for people who: ? Currently smoke. ? Have quit  within the past 15 years. ? Have at least a 30-pack-year history of smoking. A pack year is smoking an average of one pack of cigarettes a day for 1 year.  Yearly screening should continue until it has been 15 years since you quit.  Yearly screening should stop if you develop a health problem that would prevent you from having lung cancer treatment.  Breast Cancer  Practice breast self-awareness. This means understanding how your breasts normally appear and feel.  It also means doing regular breast self-exams. Let your health care  provider know about any changes, no matter how small.  If you are in your 20s or 30s, you should have a clinical breast exam (CBE) by a health care provider every 1-3 years as part of a regular health exam.  If you are 48 or older, have a CBE every year. Also consider having a breast X-ray (mammogram) every year.  If you have a family history of breast cancer, talk to your health care provider about genetic screening.  If you are at high risk for breast cancer, talk to your health care provider about having an MRI and a mammogram every year.  Breast cancer gene (BRCA) assessment is recommended for women who have family members with BRCA-related cancers. BRCA-related cancers include: ? Breast. ? Ovarian. ? Tubal. ? Peritoneal cancers.  Results of the assessment will determine the need for genetic counseling and BRCA1 and BRCA2 testing.  Cervical Cancer Your health care provider may recommend that you be screened regularly for cancer of the pelvic organs (ovaries, uterus, and vagina). This screening involves a pelvic examination, including checking for microscopic changes to the surface of your cervix (Pap test). You may be encouraged to have this screening done every 3 years, beginning at age 8.  For women ages 8-65, health care providers may recommend pelvic exams and Pap testing every 3 years, or they may recommend the Pap and pelvic exam, combined with testing for human papilloma virus (HPV), every 5 years. Some types of HPV increase your risk of cervical cancer. Testing for HPV may also be done on women of any age with unclear Pap test results.  Other health care providers may not recommend any screening for nonpregnant women who are considered low risk for pelvic cancer and who do not have symptoms. Ask your health care provider if a screening pelvic exam is right for you.  If you have had past treatment for cervical cancer or a condition that could lead to cancer, you need Pap tests  and screening for cancer for at least 20 years after your treatment. If Pap tests have been discontinued, your risk factors (such as having a new sexual partner) need to be reassessed to determine if screening should resume. Some women have medical problems that increase the chance of getting cervical cancer. In these cases, your health care provider may recommend more frequent screening and Pap tests.  Colorectal Cancer  This type of cancer can be detected and often prevented.  Routine colorectal cancer screening usually begins at 44 years of age and continues through 44 years of age.  Your health care provider may recommend screening at an earlier age if you have risk factors for colon cancer.  Your health care provider may also recommend using home test kits to check for hidden blood in the stool.  A small camera at the end of a tube can be used to examine your colon directly (sigmoidoscopy or colonoscopy). This is done to check  for the earliest forms of colorectal cancer.  Routine screening usually begins at age 75.  Direct examination of the colon should be repeated every 5-10 years through 44 years of age. However, you may need to be screened more often if early forms of precancerous polyps or small growths are found.  Skin Cancer  Check your skin from head to toe regularly.  Tell your health care provider about any new moles or changes in moles, especially if there is a change in a mole's shape or color.  Also tell your health care provider if you have a mole that is larger than the size of a pencil eraser.  Always use sunscreen. Apply sunscreen liberally and repeatedly throughout the day.  Protect yourself by wearing long sleeves, pants, a wide-brimmed hat, and sunglasses whenever you are outside.  Heart disease, diabetes, and high blood pressure  High blood pressure causes heart disease and increases the risk of stroke. High blood pressure is more likely to develop  in: ? People who have blood pressure in the high end of the normal range (130-139/85-89 mm Hg). ? People who are overweight or obese. ? People who are African American.  If you are 41-67 years of age, have your blood pressure checked every 3-5 years. If you are 32 years of age or older, have your blood pressure checked every year. You should have your blood pressure measured twice-once when you are at a hospital or clinic, and once when you are not at a hospital or clinic. Record the average of the two measurements. To check your blood pressure when you are not at a hospital or clinic, you can use: ? An automated blood pressure machine at a pharmacy. ? A home blood pressure monitor.  If you are between 44 years and 70 years old, ask your health care provider if you should take aspirin to prevent strokes.  Have regular diabetes screenings. This involves taking a blood sample to check your fasting blood sugar level. ? If you are at a normal weight and have a low risk for diabetes, have this test once every three years after 44 years of age. ? If you are overweight and have a high risk for diabetes, consider being tested at a younger age or more often. Preventing infection Hepatitis B  If you have a higher risk for hepatitis B, you should be screened for this virus. You are considered at high risk for hepatitis B if: ? You were born in a country where hepatitis B is common. Ask your health care provider which countries are considered high risk. ? Your parents were born in a high-risk country, and you have not been immunized against hepatitis B (hepatitis B vaccine). ? You have HIV or AIDS. ? You use needles to inject street drugs. ? You live with someone who has hepatitis B. ? You have had sex with someone who has hepatitis B. ? You get hemodialysis treatment. ? You take certain medicines for conditions, including cancer, organ transplantation, and autoimmune conditions.  Hepatitis C  Blood  testing is recommended for: ? Everyone born from [birth year] through 1967. ? Anyone with known risk factors for hepatitis C.  Sexually transmitted infections (STIs)  You should be screened for sexually transmitted infections (STIs) including gonorrhea and chlamydia if: ? You are sexually active and are younger than 44 years of age. ? You are older than 44 years of age and your health care provider tells you that you are at risk for  this type of infection. ? Your sexual activity has changed since you were last screened and you are at an increased risk for chlamydia or gonorrhea. Ask your health care provider if you are at risk.  If you do not have HIV, but are at risk, it may be recommended that you take a prescription medicine daily to prevent HIV infection. This is called pre-exposure prophylaxis (PrEP). You are considered at risk if: ? You are sexually active and do not regularly use condoms or know the HIV status of your partner(s). ? You take drugs by injection. ? You are sexually active with a partner who has HIV.  Talk with your health care provider about whether you are at high risk of being infected with HIV. If you choose to begin PrEP, you should first be tested for HIV. You should then be tested every 3 months for as long as you are taking PrEP. Pregnancy  If you are premenopausal and you may become pregnant, ask your health care provider about preconception counseling.  If you may become pregnant, take 400 to 800 micrograms (mcg) of folic acid every day.  If you want to prevent pregnancy, talk to your health care provider about birth control (contraception). Osteoporosis and menopause  Osteoporosis is a disease in which the bones lose minerals and strength with aging. This can result in serious bone fractures. Your risk for osteoporosis can be identified using a bone density scan.  If you are 68 years of age or older, or if you are at risk for osteoporosis and fractures, ask your  health care provider if you should be screened.  Ask your health care provider whether you should take a calcium or vitamin D supplement to lower your risk for osteoporosis.  Menopause may have certain physical symptoms and risks.  Hormone replacement therapy may reduce some of these symptoms and risks. Talk to your health care provider about whether hormone replacement therapy is right for you. Follow these instructions at home:  Schedule regular health, dental, and eye exams.  Stay current with your immunizations.  Do not use any tobacco products including cigarettes, chewing tobacco, or electronic cigarettes.  If you are pregnant, do not drink alcohol.  If you are breastfeeding, limit how much and how often you drink alcohol.  Limit alcohol intake to no more than 1 drink per day for nonpregnant women. One drink equals 12 ounces of beer, 5 ounces of wine, or 1 ounces of hard liquor.  Do not use street drugs.  Do not share needles.  Ask your health care provider for help if you need support or information about quitting drugs.  Tell your health care provider if you often feel depressed.  Tell your health care provider if you have ever been abused or do not feel safe at home. This information is not intended to replace advice given to you by your health care provider. Make sure you discuss any questions you have with your health care provider. Document Released: 07/14/2010 Document Revised: 06/06/2015 Document Reviewed: 10/02/2014 Elsevier Interactive Patient Education  [employer].

## 2017-11-06 LAB — CBC WITH DIFFERENTIAL/PLATELET
Basophils Absolute: 0.1 10*3/uL (ref 0.0–0.2)
Basos: 1 %
EOS (ABSOLUTE): 0.1 10*3/uL (ref 0.0–0.4)
EOS: 1 %
HEMATOCRIT: 45.2 % (ref 34.0–46.6)
Hemoglobin: 15.8 g/dL (ref 11.1–15.9)
IMMATURE GRANS (ABS): 0.1 10*3/uL (ref 0.0–0.1)
IMMATURE GRANULOCYTES: 1 %
LYMPHS: 26 %
Lymphocytes Absolute: 3.2 10*3/uL — ABNORMAL HIGH (ref 0.7–3.1)
MCH: 34.4 pg — ABNORMAL HIGH (ref 26.6–33.0)
MCHC: 35 g/dL (ref 31.5–35.7)
MCV: 99 fL — ABNORMAL HIGH (ref 79–97)
Monocytes Absolute: 1 10*3/uL — ABNORMAL HIGH (ref 0.1–0.9)
Monocytes: 8 %
NEUTROS PCT: 63 %
Neutrophils Absolute: 7.7 10*3/uL — ABNORMAL HIGH (ref 1.4–7.0)
PLATELETS: 425 10*3/uL (ref 150–450)
RBC: 4.59 x10E6/uL (ref 3.77–5.28)
RDW: 11.8 % — AB (ref 12.3–15.4)
WBC: 12.1 10*3/uL — AB (ref 3.4–10.8)

## 2017-11-06 LAB — COMPREHENSIVE METABOLIC PANEL
ALT: 24 IU/L (ref 0–32)
AST: 29 IU/L (ref 0–40)
Albumin/Globulin Ratio: 1.9 (ref 1.2–2.2)
Albumin: 5 g/dL (ref 3.5–5.5)
Alkaline Phosphatase: 123 IU/L — ABNORMAL HIGH (ref 39–117)
BUN/Creatinine Ratio: 8 — ABNORMAL LOW (ref 9–23)
BUN: 7 mg/dL (ref 6–24)
Bilirubin Total: 0.3 mg/dL (ref 0.0–1.2)
CALCIUM: 10.2 mg/dL (ref 8.7–10.2)
CO2: 20 mmol/L (ref 20–29)
CREATININE: 0.87 mg/dL (ref 0.57–1.00)
Chloride: 106 mmol/L (ref 96–106)
GFR calc non Af Amer: 82 mL/min/{1.73_m2} (ref >59)
GFR, EST AFRICAN AMERICAN: 94 mL/min/{1.73_m2} (ref >59)
GLOBULIN, TOTAL: 2.7 g/dL (ref 1.5–4.5)
Glucose: 101 mg/dL — ABNORMAL HIGH (ref 65–99)
Potassium: 4.5 mmol/L (ref 3.5–5.2)
Sodium: 147 mmol/L — ABNORMAL HIGH (ref 134–144)
TOTAL PROTEIN: 7.7 g/dL (ref 6.0–8.5)

## 2017-11-06 LAB — LIPID PANEL
CHOLESTEROL TOTAL: 241 mg/dL — AB (ref 100–199)
Chol/HDL Ratio: 4.3 ratio (ref 0.0–4.4)
HDL: 56 mg/dL (ref >39)
LDL Calculated: 151 mg/dL — ABNORMAL HIGH (ref 0–99)
Triglycerides: 171 mg/dL — ABNORMAL HIGH (ref 0–149)
VLDL CHOLESTEROL CAL: 34 mg/dL (ref 5–40)

## 2017-11-06 LAB — HEMOGLOBIN A1C
ESTIMATED AVERAGE GLUCOSE: 100 mg/dL
Hgb A1c MFr Bld: 5.1 % (ref 4.8–5.6)

## 2017-11-06 LAB — VITAMIN B12: Vitamin B-12: 482 pg/mL (ref 232–1245)

## 2017-11-06 LAB — TSH: TSH: 2.14 u[IU]/mL (ref 0.450–4.500)

## 2017-11-06 LAB — MAGNESIUM: Magnesium: 1.8 mg/dL (ref 1.6–2.3)

## 2017-11-08 ENCOUNTER — Encounter: Payer: Self-pay | Admitting: *Deleted

## 2019-02-27 ENCOUNTER — Other Ambulatory Visit: Payer: Self-pay

## 2019-02-27 ENCOUNTER — Emergency Department (HOSPITAL_COMMUNITY)
Admission: EM | Admit: 2019-02-27 | Discharge: 2019-02-27 | Disposition: A | Discharge status: Home / Self Care | Payer: BC Managed Care – PPO | Attending: Emergency Medicine | Admitting: Emergency Medicine

## 2019-02-27 ENCOUNTER — Encounter (HOSPITAL_COMMUNITY): Payer: Self-pay

## 2019-02-27 ENCOUNTER — Emergency Department (HOSPITAL_COMMUNITY): Payer: BC Managed Care – PPO

## 2019-02-27 DIAGNOSIS — R0789 Other chest pain: Secondary | ICD-10-CM | POA: Diagnosis present

## 2019-02-27 DIAGNOSIS — Z79899 Other long term (current) drug therapy: Secondary | ICD-10-CM | POA: Diagnosis not present

## 2019-02-27 DIAGNOSIS — Z87891 Personal history of nicotine dependence: Secondary | ICD-10-CM | POA: Diagnosis not present

## 2019-02-27 LAB — I-STAT BETA HCG BLOOD, ED (MC, WL, AP ONLY): I-stat hCG, quantitative: <5 m[IU]/mL

## 2019-02-27 LAB — CBC
HCT: 44.8 % (ref 36.0–46.0)
Hemoglobin: 15.5 g/dL — ABNORMAL HIGH (ref 12.0–15.0)
MCH: 34.5 pg — ABNORMAL HIGH (ref 26.0–34.0)
MCHC: 34.6 g/dL (ref 30.0–36.0)
MCV: 99.8 fL (ref 80.0–100.0)
Platelets: 381 10*3/uL (ref 150–400)
RBC: 4.49 MIL/uL (ref 3.87–5.11)
RDW: 12.4 % (ref 11.5–15.5)
WBC: 13.7 10*3/uL — ABNORMAL HIGH (ref 4.0–10.5)
nRBC: 0 % (ref 0.0–0.2)

## 2019-02-27 LAB — BASIC METABOLIC PANEL
Anion gap: 12 (ref 5–15)
BUN: 5 mg/dL — ABNORMAL LOW (ref 6–20)
CO2: 25 mmol/L (ref 22–32)
Calcium: 9.9 mg/dL (ref 8.9–10.3)
Chloride: 101 mmol/L (ref 98–111)
Creatinine, Ser: 0.75 mg/dL (ref 0.44–1.00)
GFR calc Af Amer: >60 mL/min (ref >60)
GFR calc non Af Amer: >60 mL/min (ref >60)
Glucose, Bld: 113 mg/dL — ABNORMAL HIGH (ref 70–99)
Potassium: 3.6 mmol/L (ref 3.5–5.1)
Sodium: 138 mmol/L (ref 135–145)

## 2019-02-27 LAB — TROPONIN I (HIGH SENSITIVITY): Troponin I (High Sensitivity): 2 ng/L (ref <18)

## 2019-02-27 MED ORDER — KETOROLAC TROMETHAMINE 60 MG/2ML IM SOLN: 15.0000 mg | Freq: Once | INTRAMUSCULAR | Status: DC

## 2019-02-27 MED ORDER — SODIUM CHLORIDE 0.9% FLUSH: 3.0000 mL | Freq: Once | INTRAVENOUS | Status: DC

## 2019-02-27 MED ORDER — KETOROLAC TROMETHAMINE 30 MG/ML IJ SOLN
15.0000 mg | Freq: Once | INTRAMUSCULAR | Status: AC
  Administered 2019-02-27: 15 mg via INTRAVENOUS
  Filled 2019-02-27: qty 1

## 2019-02-27 NOTE — ED Provider Notes (Signed)
[facility]-EMERGENCY DEPT Provider Note   CSN: [id number] Arrival date & time: 02/27/19  1624     History Chief Complaint  Patient presents with  . Chest Pain    Briggitte Boline is a 44 y.o. female.  44 yo F with a cc of chest pain.  Left sided sharp. Worse with movement, palpation, twisting.  No cough, fever. No hemoptysis, no LE edema.  No hx of PE or dvt, No estrogen use, no recent surgery or imobilization.  Denies trauma.   The history is provided by the patient.  Chest Pain Pain location:  L lateral chest Pain quality: sharp and shooting   Pain radiates to:  Does not radiate Pain severity:  Severe Onset quality:  Gradual Duration:  3 days Timing:  Constant Progression:  Worsening Chronicity:  New Context: breathing, lifting and movement   Relieved by:  Nothing Worsened by:  Certain positions, deep breathing and movement Ineffective treatments:  None tried Associated symptoms: no dizziness, no fever, no headache, no nausea, no palpitations, no shortness of breath and no vomiting        Past Medical History:  Diagnosis Date  . Anxiety     There are no problems to display for this patient.   History reviewed. No pertinent surgical history.   OB History   No obstetric history on file.     Family History  Problem Relation Age of Onset  . Cancer Father   . Diabetes Father   . Diabetes Paternal Grandmother   . Stroke Paternal Grandfather     Social History   Tobacco Use  . Smoking status: Former Smoker    Packs/day: 1.00    Years: 20.00    Pack years: 20.00    Types: Cigarettes  . Smokeless tobacco: Never Used  Substance Use Topics  . Alcohol use: Not Currently  . Drug use: Never    Home Medications Prior to Admission medications   Medication Sig Start Date End Date Taking? Authorizing Provider  cholecalciferol (VITAMIN D3) 25 MCG (1000 UNIT) tablet Take 1,000 Units by mouth daily.   Yes [provider]    Cyanocobalamin (VITAMIN B-12 PO) Take 1 tablet by mouth daily.   Yes [provider]  vitamin C (ASCORBIC ACID) 250 MG tablet Take 500 mg by mouth daily.   Yes [provider]  zinc gluconate 50 MG tablet Take 50 mg by mouth daily.   Yes [provider]  famotidine (PEPCID) 20 MG tablet Take 1 tablet (20 mg total) by mouth 2 (two) times daily. Patient not taking: Reported on 11/05/2017 05/22/15   Trixie Dredge, PA-C  promethazine (PHENERGAN) 25 MG tablet Take 1 tablet (25 mg total) by mouth every 6 (six) hours as needed for nausea or vomiting. Patient not taking: Reported on 11/05/2017 05/22/15   Trixie Dredge, PA-C    Allergies    Patient has no known allergies.  Review of Systems   Review of Systems  Constitutional: Negative for chills and fever.  HENT: Negative for congestion and rhinorrhea.   Eyes: Negative for redness and visual disturbance.  Respiratory: Negative for shortness of breath and wheezing.   Cardiovascular: Positive for chest pain. Negative for palpitations.  Gastrointestinal: Negative for nausea and vomiting.  Genitourinary: Negative for dysuria and urgency.  Musculoskeletal: Negative for arthralgias and myalgias.  Skin: Negative for pallor and wound.  Neurological: Negative for dizziness and headaches.    Physical Exam Updated Vital Signs BP 131/89   Pulse  80   Temp 98.3 F (36.8 C) (Oral)   Resp 15   Ht 5\' 9"  (1.753 m)   Wt 86.2 kg   LMP 02/20/2019   SpO2 100%   BMI 28.06 kg/m   Physical Exam Vitals and nursing note reviewed.  Constitutional:      General: She is not in acute distress.    Appearance: She is well-developed. She is not diaphoretic.  HENT:     Head: Normocephalic and atraumatic.  Eyes:     Pupils: Pupils are equal, round, and reactive to light.  Cardiovascular:     Rate and Rhythm: Normal rate and regular rhythm.     Heart sounds: No murmur. No friction rub. No gallop.   Pulmonary:     Effort: Pulmonary  effort is normal.     Breath sounds: No wheezing or rales.  Chest:     Chest wall: Tenderness present.     Comments: Tenderness with palpation about the lateral clavicular line about ribs 4 through 6.  Reproduces the patient's pain.  No rash. Abdominal:     General: There is no distension.     Palpations: Abdomen is soft.     Tenderness: There is no abdominal tenderness.  Musculoskeletal:        General: No tenderness.     Cervical back: Normal range of motion and neck supple.  Skin:    General: Skin is warm and dry.  Neurological:     Mental Status: She is alert and oriented to person, place, and time.  Psychiatric:        Behavior: Behavior normal.     ED Results / Procedures / Treatments   Labs (all labs ordered are listed, but only abnormal results are displayed) Labs Reviewed  BASIC METABOLIC PANEL - Abnormal; Notable for the following components:      Result Value   Glucose, Bld 113 (*)    BUN 5 (*)    All other components within normal limits  CBC - Abnormal; Notable for the following components:   WBC 13.7 (*)    Hemoglobin 15.5 (*)    MCH 34.5 (*)    All other components within normal limits  I-STAT BETA HCG BLOOD, ED ([facility], [facility], [facility] ONLY)  TROPONIN I (HIGH SENSITIVITY)  TROPONIN I (HIGH SENSITIVITY)    EKG EKG Interpretation  Date/Time:  Monday February 27 2019 16:33:26 EST Ventricular Rate:  97 PR Interval:    QRS Duration: 88 QT Interval:  344 QTC Calculation: 437 R Axis:   63 Text Interpretation: Sinus rhythm Probable left atrial enlargement Baseline wander in lead(s) V4 No significant change since last tracing Confirmed by 12[birth year]12[birth year]1992 [phone]) on 02/27/2019 4:46:47 PM   Radiology DG Chest 2 View  Result Date: 02/27/2019 CLINICAL DATA:  Chest pain EXAM: CHEST - 2 VIEW COMPARISON:  None. FINDINGS: The heart size and mediastinal contours are within normal limits. Both lungs are clear. The visualized skeletal structures are unremarkable. IMPRESSION: No  active cardiopulmonary disease. Electronically Signed   By: 03/01/2019 M.D.   On: 02/27/2019 16:55    Procedures Procedures (including critical care time)  Medications Ordered in ED Medications  sodium chloride flush (NS) 0.9 % injection 3 mL (0 mLs Intravenous Hold 02/27/19 1837)  ketorolac (TORADOL) 30 MG/ML injection 15 mg (15 mg Intravenous Given 02/27/19 1747)    ED Course  I have reviewed the triage vital signs and the nursing notes.  Pertinent labs & imaging results that were available  during my care of the patient were reviewed by me and considered in my medical decision making (see chart for details).    MDM Rules/Calculators/A&P                      44 yo F with a chief complaints of left-sided chest pain.  This is reproduced on exam.  Patient is well-appearing and nontoxic.  Chest x-ray viewed by me without focal infiltrate or pneumothorax.  EKG with no concerning findings.  Lab work was ordered in triage.  I do not feel that it is necessary, since it is already been obtained will await further to the results.  No recent to [facility] as is so atypical.   Lab work is unremarkable.  No significant anemia no significant electrolyte abnormality her troponin is completely negative.  At this point will discharge home.  Tylenol and ibuprofen for pain.  PCP follow-up.  6:55 PM:  I have discussed the diagnosis/risks/treatment options with the patient and believe the pt to be eligible for discharge home to follow-up with PCP. We also discussed returning to the ED immediately if new or worsening sx occur. We discussed the sx which are most concerning (e.g., sudden worsening pain, fever, inability to tolerate by mouth) that necessitate immediate return. Medications administered to the patient during their visit and any new prescriptions provided to the patient are listed below.  Medications given during this visit Medications  sodium chloride flush (NS) 0.9 % injection 3 mL (0 mLs Intravenous  Hold 02/27/19 1837)  ketorolac (TORADOL) 30 MG/ML injection 15 mg (15 mg Intravenous Given 02/27/19 1747)     The patient appears reasonably screen and/or stabilized for discharge and I doubt any other medical condition or other [facility] requiring further screening, evaluation, or treatment in the ED at this time prior to discharge.   Final Clinical Impression(s) / ED Diagnoses Final diagnoses:  Atypical chest pain  Chest wall pain    Rx / DC Orders ED Discharge Orders    None       Deno Etienne, DO 02/27/19 1855

## 2019-02-27 NOTE — ED Triage Notes (Signed)
Patient c/o intermittentleft chest pain that radiates into the back. X 3 days. Patient states, "Today is the worst." Patient denies any SOB, but when she takes a deep breath, the pain is worse.

## 2019-02-27 NOTE — Discharge Instructions (Signed)
Take 4 over the counter ibuprofen tablets 3 times a day or 2 over-the-counter naproxen tablets twice a day for pain. Also take tylenol 1000mg(2 extra strength) four times a day.    

## 2019-02-27 NOTE — ED Notes (Signed)
Pt ambulatory to and from bathroom with a steady gait.  

## 2019-02-27 NOTE — ED Notes (Signed)
Pt ambulatory to Xray.

## 2020-07-09 IMAGING — CR DG CHEST 2V
2 series · 2 of 2 positions shown · non-contrast
Comparison: None.

CLINICAL DATA: Chest pain

EXAM:
CHEST - 2 VIEW

[w chest pa]
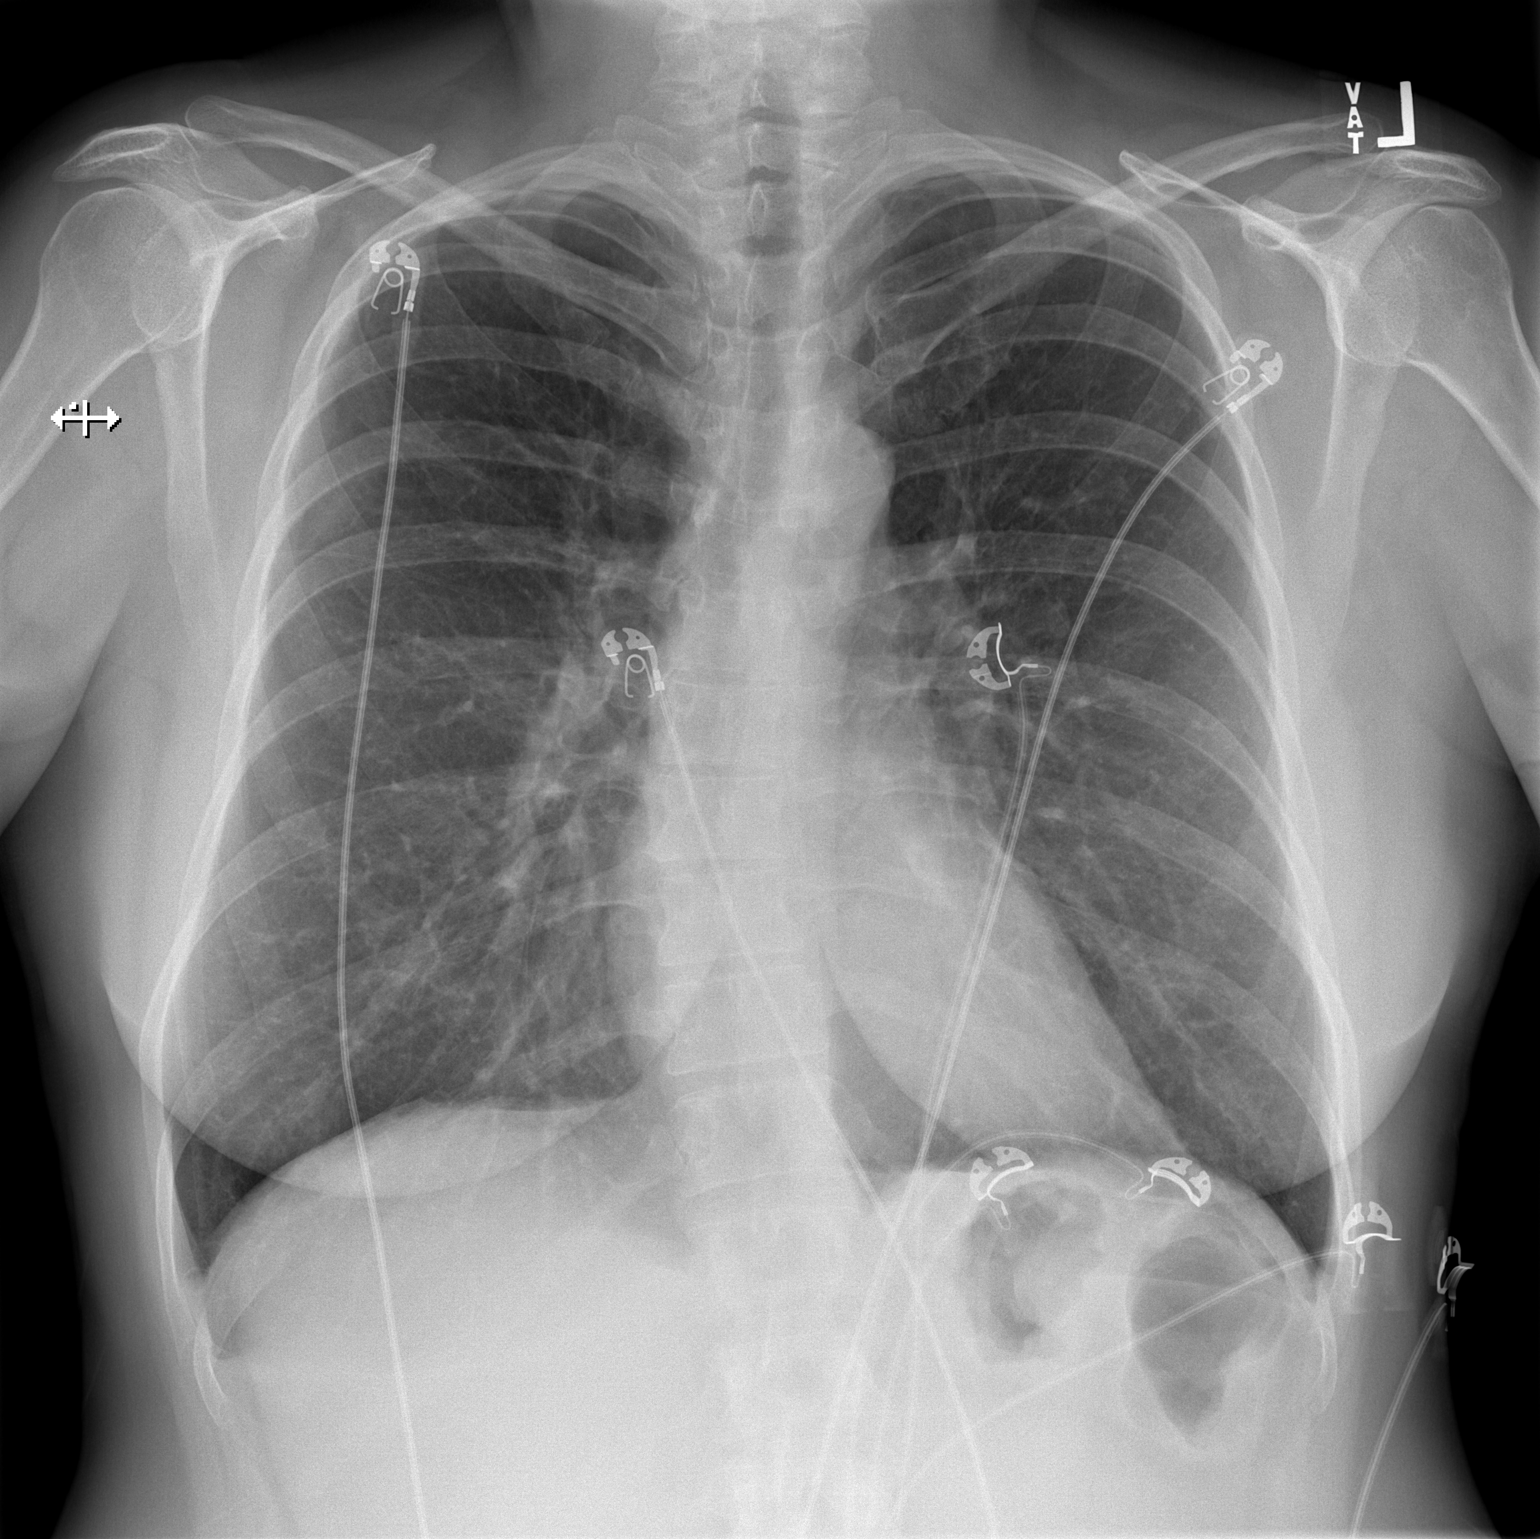

[w chest lat]
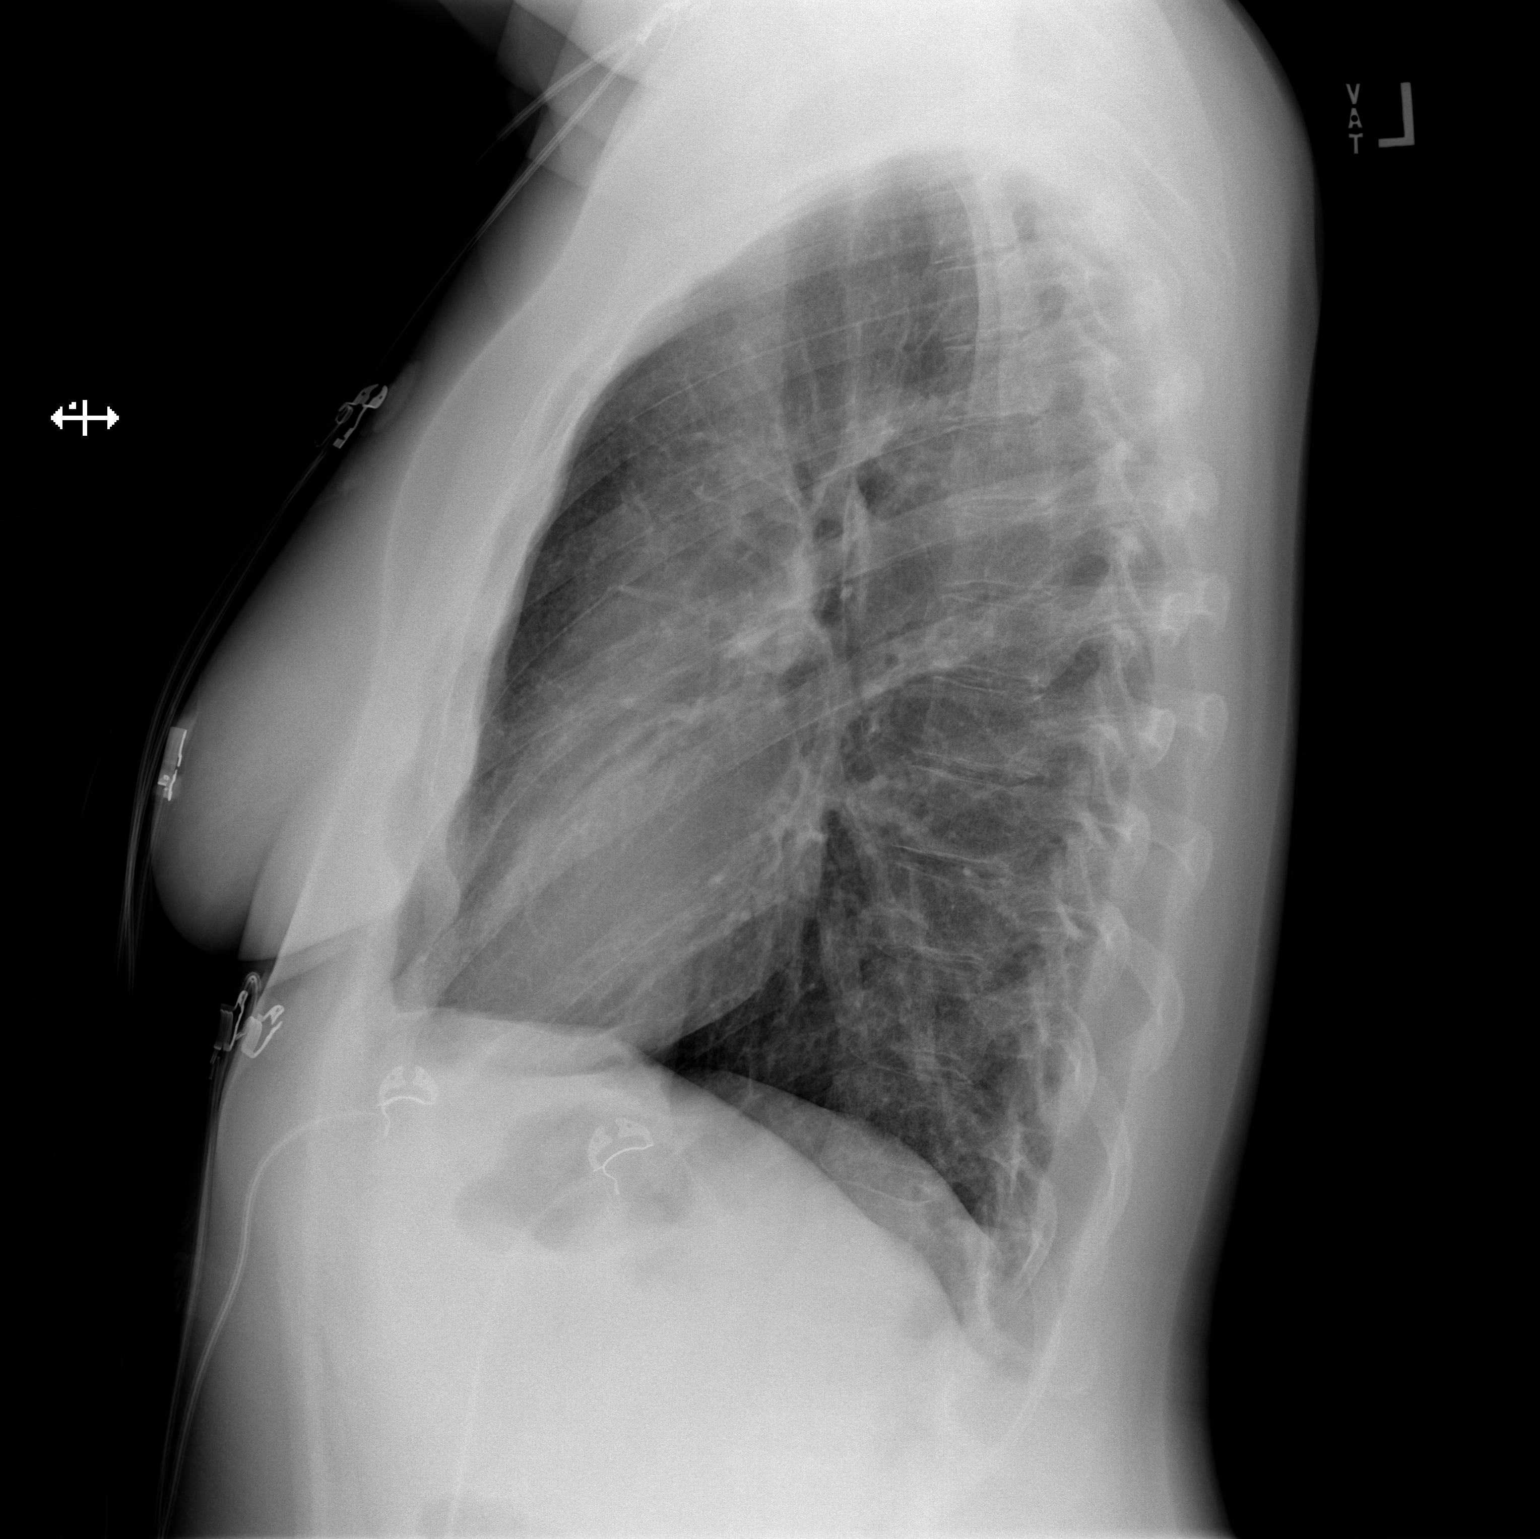

[2 of 2 positions shown; findings below may reference images not displayed]

FINDINGS: The heart size and mediastinal contours are within normal limits.
Both lungs are clear. The visualized skeletal structures are
unremarkable.
IMPRESSION: No active cardiopulmonary disease.

## 2024-01-24 ENCOUNTER — Ambulatory Visit (INDEPENDENT_AMBULATORY_CARE_PROVIDER_SITE_OTHER): Admitting: Otolaryngology

## 2024-01-24 ENCOUNTER — Encounter (INDEPENDENT_AMBULATORY_CARE_PROVIDER_SITE_OTHER): Payer: Self-pay | Admitting: Otolaryngology

## 2024-01-24 VITALS — BP 122/76 | HR 80 | Ht 67.0 in | Wt 164.0 lb

## 2024-01-24 DIAGNOSIS — H93233 Hyperacusis, bilateral: Secondary | ICD-10-CM | POA: Insufficient documentation

## 2024-01-24 DIAGNOSIS — H9313 Tinnitus, bilateral: Secondary | ICD-10-CM | POA: Insufficient documentation

## 2024-01-24 DIAGNOSIS — R42 Dizziness and giddiness: Secondary | ICD-10-CM | POA: Insufficient documentation

## 2024-01-24 NOTE — Progress Notes (Signed)
 CC: Dizziness, bilateral tinnitus  Discussed the use of AI scribe software for clinical note transcription with the patient, who gave verbal consent to proceed.  History of Present Illness Renee Compton is a 49 year old female who presents today complaining of dizziness and bilateral tinnitus.  She has experienced persistent bilateral tinnitus for a little over one year, described as bothersome and perceived in both ears. The onset was noted around the time of dental work, though she is uncertain of any association.  She reports intermittent episodes of dizziness, lightheadedness, and imbalance, typically triggered by abrupt movements or rapid head turns. These episodes are mild, transient, and do not interfere with daily activities. She denies true vertigo or spinning sensations.  Over the past few months, she has developed bilateral hyperacusis, which is less prominent than the tinnitus.  She denies hearing loss, otalgia, otorrhea, history of otitis media, or prior otologic surgery. She noted transient ear soreness for one day following a recent otoscopic examination, but there is no ongoing discomfort. She is able to understand speech without difficulty.   Past Medical History:  Diagnosis Date   Anxiety     History reviewed. No pertinent surgical history.  Family History  Problem Relation Age of Onset   Cancer Father    Diabetes Father    Diabetes Paternal Grandmother    Stroke Paternal Grandfather     Social History:  reports that she has quit smoking. Her smoking use included cigarettes. She has a 20 pack-year smoking history. She has never used smokeless tobacco. She reports that she does not currently use alcohol. She reports that she does not use drugs.  Allergies: Allergies[1]  Prior to Admission medications  Medication Sig Start Date End Date Taking? Authorizing Provider  cholecalciferol (VITAMIN D3) 25 MCG (1000 UNIT) tablet Take 1,000 Units by mouth daily.   Yes  [provider]  Cyanocobalamin (VITAMIN B-12 PO) Take 1 tablet by mouth daily.   Yes [provider]  vitamin C (ASCORBIC ACID) 250 MG tablet Take 500 mg by mouth daily.   Yes [provider]  zinc gluconate 50 MG tablet Take 50 mg by mouth daily.   Yes [provider]  famotidine  (PEPCID ) 20 MG tablet Take 1 tablet (20 mg total) by mouth 2 (two) times daily. Patient not taking: Reported on 01/24/2024 05/22/15   Devora Perkins, PA-C  promethazine  (PHENERGAN ) 25 MG tablet Take 1 tablet (25 mg total) by mouth every 6 (six) hours as needed for nausea or vomiting. Patient not taking: Reported on 01/24/2024 05/22/15   Devora Perkins, PA-C    Blood pressure 122/76, pulse 80, height 5' 7 (1.702 m), weight 164 lb (74.4 kg), SpO2 96%. Exam: General: Communicates without difficulty, well nourished, no acute distress. Head: Normocephalic, no evidence injury, no tenderness, facial buttresses intact without stepoff. Face/sinus: No tenderness to palpation and percussion. Facial movement is normal and symmetric. Eyes: PERRL, EOMI. No scleral icterus, conjunctivae clear. Neuro: CN II exam reveals vision grossly intact.  No nystagmus at any point of gaze. Ears: Auricles well formed without lesions.  Ear canals are intact without mass or lesion.  No erythema or edema is appreciated.  The TMs are intact without fluid. Nose: External evaluation reveals normal support and skin without lesions.  Dorsum is intact.  Anterior rhinoscopy reveals normal mucosa over anterior aspect of inferior turbinates and intact septum.  No purulence noted. Oral:  Oral cavity and oropharynx are intact, symmetric, without erythema or edema.  Mucosa is  moist without lesions. Neck: Full range of motion without pain.  There is no significant lymphadenopathy.  No masses palpable.  Thyroid bed within normal limits to palpation.  Parotid glands and submandibular glands equal bilaterally without mass.  Trachea is midline.  Neuro:  CN 2-12 grossly intact. Vestibular: No nystagmus at any point of gaze. Dix Hallpike negative. Vestibular: There is no nystagmus with pneumatic pressure on either tympanic membrane or Valsalva. The cerebellar examination is unremarkable.    Assessment and Plan Assessment & Plan Recurrent dizziness Recurrent dizziness of unknown etiology. The possible differential diagnoses include transient BPPV, vestibular migraine, Meniere's disease, peripheral vestibular dysfunction, or other central/systemic causes.   - The pathophysiology of vestibular dysfunction and dizziness are discussed extensively with the patient. The possible differential diagnoses are reviewed. Questions are invited and answered.   - Currently the patient is not severely symptomatic.  The option of vestibular rehab to treat her dizziness is discussed.  Bilateral tinnitus She has experienced persistent bilateral tinnitus for over a year. Symptoms are bothersome but do not significantly interfere with daily activities. Otoscopic examination demonstrated normal external auditory canals, tympanic membranes, and middle ear spaces.  - Ordered audiogram to evaluate for hearing loss. - Recommended sound maskers (fan, radio, TV, white noise app), particularly at bedtime. - The strategies to cope with tinnitus, including the use of masker, hearing aids, tinnitus retraining therapy, and avoidance of caffeine and alcohol are discussed. - Advised to return sooner if symptoms worsen or new concerning symptoms develop. - Planned routine follow-up in one year.  Bilateral hyperacusis She reports increased sensitivity to loud sounds, more prominent in recent months but less significant than her tinnitus. Symptoms have not impacted daily functioning and are considered benign at this time. - Provided reassurance regarding the benign nature of symptoms. - Advised to report any progression or impact on daily life.   Renee Compton Renee Compton Renee Compton 01/24/2024, 3:27  PM      [1] No Known Allergies

## 2024-01-26 ENCOUNTER — Other Ambulatory Visit: Payer: Self-pay

## 2024-01-26 ENCOUNTER — Emergency Department (HOSPITAL_COMMUNITY)

## 2024-01-26 ENCOUNTER — Emergency Department (HOSPITAL_COMMUNITY)
Admission: EM | Admit: 2024-01-26 | Discharge: 2024-01-26 | Disposition: A | Discharge status: Home / Self Care | Attending: Emergency Medicine | Admitting: Emergency Medicine

## 2024-01-26 ENCOUNTER — Encounter (HOSPITAL_COMMUNITY): Payer: Self-pay | Admitting: Emergency Medicine

## 2024-01-26 DIAGNOSIS — R739 Hyperglycemia, unspecified: Secondary | ICD-10-CM | POA: Diagnosis not present

## 2024-01-26 DIAGNOSIS — F419 Anxiety disorder, unspecified: Secondary | ICD-10-CM | POA: Diagnosis present

## 2024-01-26 DIAGNOSIS — F418 Other specified anxiety disorders: Secondary | ICD-10-CM | POA: Insufficient documentation

## 2024-01-26 DIAGNOSIS — F410 Panic disorder [episodic paroxysmal anxiety] without agoraphobia: Secondary | ICD-10-CM

## 2024-01-26 DIAGNOSIS — R0602 Shortness of breath: Secondary | ICD-10-CM | POA: Diagnosis not present

## 2024-01-26 DIAGNOSIS — F32A Depression, unspecified: Secondary | ICD-10-CM

## 2024-01-26 LAB — COMPREHENSIVE METABOLIC PANEL WITH GFR
ALT: 6 U/L (ref 0–44)
AST: 14 U/L — ABNORMAL LOW (ref 15–41)
Albumin: 3.8 g/dL (ref 3.5–5.0)
Alkaline Phosphatase: 88 U/L (ref 38–126)
Anion gap: 10 (ref 5–15)
BUN: 11 mg/dL (ref 6–20)
CO2: 22 mmol/L (ref 22–32)
Calcium: 8.9 mg/dL (ref 8.9–10.3)
Chloride: 108 mmol/L (ref 98–111)
Creatinine, Ser: 0.67 mg/dL (ref 0.44–1.00)
GFR, Estimated: >60 mL/min
Glucose, Bld: 101 mg/dL — ABNORMAL HIGH (ref 70–99)
Potassium: 3.6 mmol/L (ref 3.5–5.1)
Sodium: 140 mmol/L (ref 135–145)
Total Bilirubin: 0.4 mg/dL (ref 0.0–1.2)
Total Protein: 6.2 g/dL — ABNORMAL LOW (ref 6.5–8.1)

## 2024-01-26 LAB — CBC WITH DIFFERENTIAL/PLATELET
Abs Immature Granulocytes: 0.06 K/uL (ref 0.00–0.07)
Basophils Absolute: 0 K/uL (ref 0.0–0.1)
Basophils Relative: 0 %
Eosinophils Absolute: 0 K/uL (ref 0.0–0.5)
Eosinophils Relative: 0 %
HCT: 42.8 % (ref 36.0–46.0)
Hemoglobin: 15.3 g/dL — ABNORMAL HIGH (ref 12.0–15.0)
Immature Granulocytes: 0 %
Lymphocytes Relative: 17 %
Lymphs Abs: 2.3 K/uL (ref 0.7–4.0)
MCH: 34 pg (ref 26.0–34.0)
MCHC: 35.7 g/dL (ref 30.0–36.0)
MCV: 95.1 fL (ref 80.0–100.0)
Monocytes Absolute: 1.1 K/uL — ABNORMAL HIGH (ref 0.1–1.0)
Monocytes Relative: 8 %
Neutro Abs: 10.1 K/uL — ABNORMAL HIGH (ref 1.7–7.7)
Neutrophils Relative %: 75 %
Platelets: 352 K/uL (ref 150–400)
RBC: 4.5 MIL/uL (ref 3.87–5.11)
RDW: 13.4 % (ref 11.5–15.5)
WBC: 13.6 K/uL — ABNORMAL HIGH (ref 4.0–10.5)
nRBC: 0 % (ref 0.0–0.2)

## 2024-01-26 LAB — D-DIMER, QUANTITATIVE: D-Dimer, Quant: <0.27 ug{FEU}/mL (ref 0.00–0.50)

## 2024-01-26 LAB — HCG, SERUM, QUALITATIVE: Preg, Serum: NEGATIVE

## 2024-01-26 LAB — TROPONIN T, HIGH SENSITIVITY
Troponin T High Sensitivity: <15 ng/L (ref 0–19)
Troponin T High Sensitivity: <15 ng/L (ref 0–19)

## 2024-01-26 MED ORDER — SODIUM CHLORIDE 0.9 % IV BOLUS
1000.0000 mL | Freq: Once | INTRAVENOUS | Status: AC
  Administered 2024-01-26: 1000 mL via INTRAVENOUS

## 2024-01-26 MED ORDER — HYDROXYZINE HCL 25 MG PO TABS: 25.0000 mg | ORAL_TABLET | Freq: Four times a day (QID) | ORAL | 2 refills | Status: AC | PRN

## 2024-01-26 NOTE — Discharge Instructions (Addendum)
 Your evaluation did not show any serious causes for your shortness of breath, this most likely is a combination of anxiety and depression.  Please follow-up with one of the mental health services to evaluate your depression.  Please continue to work with your medical providers to find the cause of the ringing in your ears.

## 2024-01-26 NOTE — ED Provider Notes (Signed)
 " [place] EMERGENCY DEPARTMENT AT [facility] Provider Note   CSN: [id number] Arrival date & time: 01/26/24  0015     Patient presents with: Panic Attack   Renee Compton is a 49 y.o. female.   The history is provided by the patient and the EMS personnel.   She has history of anxiety and is brought in by ambulance because of difficulty breathing tonight.  This came on suddenly.  She denies chest pain, heaviness, tightness, pressure.  She denies nausea or vomiting or diaphoresis.  EMS treated her with haloperidol and midazolam.  Following this, she had oxygen desaturation and was placed on nasal oxygen.  She relates that she is very frustrated over ringing in her ears and recently saw an ENT physician without any diagnosis made.  She does endorse crying spells and early morning wakening but denies anhedonia.  She denies suicidal and homicidal ideation.  Also, she is now complaining of feeling dizzy.    Prior to Admission medications  Medication Sig Start Date End Date Taking? Authorizing Provider  cholecalciferol (VITAMIN D3) 25 MCG (1000 UNIT) tablet Take 1,000 Units by mouth daily.    [provider]  Cyanocobalamin (VITAMIN B-12 PO) Take 1 tablet by mouth daily.    [provider]  famotidine  (PEPCID ) 20 MG tablet Take 1 tablet (20 mg total) by mouth 2 (two) times daily. Patient not taking: Reported on 01/24/2024 05/22/15   Devora Perkins, PA-C  promethazine  (PHENERGAN ) 25 MG tablet Take 1 tablet (25 mg total) by mouth every 6 (six) hours as needed for nausea or vomiting. Patient not taking: Reported on 01/24/2024 05/22/15   Devora Perkins, PA-C  vitamin C (ASCORBIC ACID) 250 MG tablet Take 500 mg by mouth daily.    [provider]  zinc gluconate 50 MG tablet Take 50 mg by mouth daily.    [provider]    Allergies: Patient has no known allergies.    Review of Systems  All other systems reviewed and are negative.   Updated Vital  Signs BP 101/67 (BP Location: Right Arm) Comment: Simultaneous filing. User may not have seen previous data.  Pulse 92 Comment: Simultaneous filing. User may not have seen previous data.  Temp (!) 97.5 F (36.4 C) (Tympanic)   Resp (!) 25 Comment: Simultaneous filing. User may not have seen previous data.  LMP 12/31/2023 (Approximate)   SpO2 90% Comment: Simultaneous filing. User may not have seen previous data.  Physical Exam Vitals and nursing note reviewed.   49 year old female, resting comfortably and in no acute distress. Vital signs are significant for elevated respiratory rate. Oxygen saturation is 90%, which is normal but borderline low. Head is normocephalic and atraumatic. PERRLA, EOMI. Oropharynx is clear. Neck is nontender and supple without adenopathy. Lungs are clear without rales, wheezes, or rhonchi. Chest is nontender. Heart has regular rate and rhythm without murmur. Abdomen is soft, flat, nontender. Extremities have no cyanosis or edema, full range of motion is present. Skin is warm and dry without rash. Neurologic: Awake and alert and oriented, cranial nerves are intact, moves all extremities equally. Psychiatric: Normal affect but has mild emotional lability.  (all labs ordered are listed, but only abnormal results are displayed) Labs Reviewed  CBC WITH DIFFERENTIAL/PLATELET - Abnormal; Notable for the following components:      Result Value   WBC 13.6 (*)    Hemoglobin 15.3 (*)    Neutro Abs 10.1 (*)    Monocytes Absolute 1.1 (*)  All other components within normal limits  COMPREHENSIVE METABOLIC PANEL WITH GFR - Abnormal; Notable for the following components:   Glucose, Bld 101 (*)    Total Protein 6.2 (*)    AST 14 (*)    All other components within normal limits  D-DIMER, QUANTITATIVE (NOT AT [facility])  HCG, SERUM, QUALITATIVE  TROPONIN T, HIGH SENSITIVITY  TROPONIN T, HIGH SENSITIVITY    EKG: EKG Interpretation Date/Time:  Wednesday January 26 2024 00:26:15 EST Ventricular Rate:  93 PR Interval:  157 QRS Duration:  90 QT Interval:  351 QTC Calculation: 437 R Axis:   57  Text Interpretation: Sinus rhythm Consider left atrial enlargement Low voltage, precordial leads When compared with ECG of 02/27/2019, No significant change was found Confirmed by Raford Lenis ([id number]) on 01/26/2024 1:02:27 AM  Radiology: [facility] Chest Port 1 View Result Date: 01/26/2024 EXAM: 1 VIEW(S) XRAY OF THE CHEST 01/26/2024 01:00:00 AM COMPARISON: None available. CLINICAL HISTORY: The patient reports shortness of breath. FINDINGS: LUNGS AND PLEURA: No focal pulmonary opacity. No pleural effusion. No pneumothorax. HEART AND MEDIASTINUM: No acute abnormality of the cardiac and mediastinal silhouettes. BONES AND SOFT TISSUES: No acute osseous abnormality. IMPRESSION: 1. No acute process. Electronically signed by: Dorethia Molt MD 01/26/2024 01:08 AM EST RP Workstation: [id number]     Procedures   Medications Ordered in the ED  sodium chloride  0.9 % bolus 1,000 mL (1,000 mLs Intravenous New Bag/Given 01/26/24 0121)                                    Medical Decision Making Amount and/or Complexity of Data Reviewed Labs: ordered. Radiology: ordered.  Risk Prescription drug management.   Shortness of breath.  Differential diagnosis includes, but is not limited to, panic attack, ACS, pulmonary embolism, pneumonia, pneumothorax.  She has borderline low oxygen saturation, but this may be residual effects of midazolam and haloperidol.  I have ordered nasal oxygen.  Blood pressure is also borderline low and I have ordered IV fluids.  Laboratory workup has been ordered including troponin and D-dimer, also ECG and chest x-ray.  I have reviewed her laboratory tests, my interpretation is minimally elevated random glucose level and otherwise normal comprehensive metabolic panel, normal D-dimer, normal troponin x 2, not pregnant, mild leukocytosis which is nonspecific,  otherwise normal CBC.  Chest x-ray shows no acute cardiopulmonary process.  Have independently viewed the image, and agree with the radiologist's interpretation.  I have reevaluated the patient, she is feeling somewhat better.  I discussed with her that she might benefit from evaluation and treatment of her depression.  I am discharging her with prescription for hydroxyzine  to use as needed for anxiety, and outpatient resources for mental health services.  Return precautions discussed.     Final diagnoses:  Anxiety attack  Shortness of breath  Depression, unspecified depression type  Elevated random blood glucose level    ED Discharge Orders          Ordered    hydrOXYzine  (ATARAX ) 25 MG tablet  Every 6 hours PRN        01/26/24 9371               Raford Lenis, MD 01/26/24 [phone]  "

## 2024-01-26 NOTE — ED Triage Notes (Signed)
 BIBA from home for shob/panic attack. O2 started to desat on RA after meds given by EMS and pt placed on 2L [place].   PTA  5mg  haldol 5mg  Versed 20g L hand

## 2024-02-09 ENCOUNTER — Ambulatory Visit (HOSPITAL_COMMUNITY): Admission: EM | Admit: 2024-02-09 | Discharge: 2024-02-09 | Disposition: A | Discharge status: Home / Self Care

## 2024-02-09 DIAGNOSIS — F4321 Adjustment disorder with depressed mood: Secondary | ICD-10-CM

## 2024-02-09 DIAGNOSIS — F411 Generalized anxiety disorder: Secondary | ICD-10-CM

## 2024-02-09 DIAGNOSIS — F4323 Adjustment disorder with mixed anxiety and depressed mood: Secondary | ICD-10-CM

## 2024-02-09 NOTE — Discharge Summary (Signed)
 Renee Compton to be discharged Home per NP order. An After Visit Summary was printed and given to the patient by provider.Patient escorted out and discharged home via private auto.  [name]  02/09/2024 11:49 AM
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
## 2024-02-09 NOTE — Discharge Instructions (Addendum)

## 2024-02-25 ENCOUNTER — Ambulatory Visit (INDEPENDENT_AMBULATORY_CARE_PROVIDER_SITE_OTHER): Admitting: Audiology

## 2024-02-28 ENCOUNTER — Ambulatory Visit (HOSPITAL_COMMUNITY): Admitting: Psychiatry
